# Patient Record
Sex: Female | Born: 1975 | Race: Black or African American | Hispanic: No | Marital: Single | Smoking: Current every day smoker
Health system: Southern US, Community
[De-identification: ages and names within clinical notes are randomized; demographics above are authoritative.]

## PROBLEM LIST (undated history)

## (undated) DIAGNOSIS — D649 Anemia, unspecified: Secondary | ICD-10-CM

## (undated) DIAGNOSIS — Z720 Tobacco use: Secondary | ICD-10-CM

## (undated) DIAGNOSIS — R87610 Atypical squamous cells of undetermined significance on cytologic smear of cervix (ASC-US): Secondary | ICD-10-CM

## (undated) DIAGNOSIS — E0590 Thyrotoxicosis, unspecified without thyrotoxic crisis or storm: Secondary | ICD-10-CM

## (undated) DIAGNOSIS — D259 Leiomyoma of uterus, unspecified: Secondary | ICD-10-CM

## (undated) DIAGNOSIS — I10 Essential (primary) hypertension: Secondary | ICD-10-CM

## (undated) DIAGNOSIS — N938 Other specified abnormal uterine and vaginal bleeding: Secondary | ICD-10-CM

## (undated) DIAGNOSIS — A528 Late syphilis, latent: Secondary | ICD-10-CM

## (undated) DIAGNOSIS — R87613 High grade squamous intraepithelial lesion on cytologic smear of cervix (HGSIL): Secondary | ICD-10-CM

## (undated) DIAGNOSIS — F419 Anxiety disorder, unspecified: Secondary | ICD-10-CM

## (undated) DIAGNOSIS — A5901 Trichomonal vulvovaginitis: Secondary | ICD-10-CM

## (undated) HISTORY — DX: Anxiety disorder, unspecified: F41.9

## (undated) HISTORY — DX: Trichomonal vulvovaginitis: A59.01

## (undated) HISTORY — DX: High grade squamous intraepithelial lesion on cytologic smear of cervix (HGSIL): R87.613

## (undated) HISTORY — DX: Thyrotoxicosis, unspecified without thyrotoxic crisis or storm: E05.90

## (undated) HISTORY — DX: Atypical squamous cells of undetermined significance on cytologic smear of cervix (ASC-US): R87.610

---

## 2004-09-28 ENCOUNTER — Emergency Department: Payer: Self-pay | Admitting: Emergency Medicine

## 2006-01-10 ENCOUNTER — Emergency Department: Payer: Self-pay | Admitting: Internal Medicine

## 2006-09-05 ENCOUNTER — Emergency Department: Payer: Self-pay | Admitting: Emergency Medicine

## 2006-09-07 ENCOUNTER — Emergency Department: Payer: Self-pay | Admitting: Emergency Medicine

## 2007-02-02 ENCOUNTER — Ambulatory Visit: Payer: Self-pay

## 2007-02-03 ENCOUNTER — Inpatient Hospital Stay: Payer: Self-pay

## 2010-08-03 ENCOUNTER — Ambulatory Visit: Payer: Self-pay

## 2010-08-09 ENCOUNTER — Ambulatory Visit: Payer: Self-pay

## 2010-08-13 LAB — PATHOLOGY REPORT

## 2016-02-27 ENCOUNTER — Emergency Department
Admission: EM | Admit: 2016-02-27 | Discharge: 2016-02-27 | Disposition: A | Discharge status: Home / Self Care | Payer: Self-pay | Attending: Emergency Medicine | Admitting: Emergency Medicine

## 2016-02-27 ENCOUNTER — Emergency Department: Payer: Self-pay

## 2016-02-27 ENCOUNTER — Encounter: Payer: Self-pay | Admitting: Emergency Medicine

## 2016-02-27 DIAGNOSIS — J111 Influenza due to unidentified influenza virus with other respiratory manifestations: Secondary | ICD-10-CM | POA: Insufficient documentation

## 2016-02-27 DIAGNOSIS — J40 Bronchitis, not specified as acute or chronic: Secondary | ICD-10-CM | POA: Insufficient documentation

## 2016-02-27 DIAGNOSIS — F17210 Nicotine dependence, cigarettes, uncomplicated: Secondary | ICD-10-CM | POA: Insufficient documentation

## 2016-02-27 MED ORDER — FLUTICASONE PROPIONATE 50 MCG/ACT NA SUSP: 2.0000 | Freq: Every day | NASAL | 0 refills | Status: DC

## 2016-02-27 MED ORDER — AZITHROMYCIN 250 MG PO TABS: ORAL_TABLET | ORAL | 0 refills | Status: DC

## 2016-02-27 MED ORDER — PROMETHAZINE-DM 6.25-15 MG/5ML PO SYRP: 5.0000 mL | ORAL_SOLUTION | Freq: Four times a day (QID) | ORAL | 0 refills | Status: DC | PRN

## 2016-02-27 MED ORDER — OSELTAMIVIR PHOSPHATE 75 MG PO CAPS: 75.0000 mg | ORAL_CAPSULE | Freq: Two times a day (BID) | ORAL | 0 refills | Status: DC

## 2016-02-27 NOTE — ED Triage Notes (Signed)
States she developed congestion cough about 2 weeks ago  Then sx's eased off   But returned 2 days ago

## 2016-02-27 NOTE — ED Provider Notes (Signed)
[facility] Emergency Department Provider Note  ____________________________________________  Time seen: Approximately 10:23 AM  I have reviewed the triage vital signs and the nursing notes.   HISTORY  Chief Complaint Cough and Generalized Body Aches    HPI Natalie Gregory is a 41 y.o. female , NAD, presents to the emergency department with 2 day history of either, chills, body aches, cough and chest congestion. Patient states she was ill with similar symptoms approximately 2 weeks ago. Symptoms resolved but over the last 2 days she states the symptoms reappeared suddenly and were worse. Has taken over-the-counter medications with little relief of her symptoms. Has been exposed to a family member who is had flu over the last week. Denies any chest pain, shortness breath, wheezing, abdominal pain, nausea, vomiting or diarrhea. No changes in urinary habits. Does note generalized body aches with the worst being at her back. States the body aches were so bad yesterday but she could not get out of bed. Denies injury or trauma.   History reviewed. No pertinent past medical history.  There are no active problems to display for this patient.   History reviewed. No pertinent surgical history.  Prior to Admission medications   Medication Sig Start Date End Date Taking? Authorizing Provider  azithromycin (ZITHROMAX Z-PAK) 250 MG tablet Take 2 tablets (500 mg) on  Day 1,  followed by 1 tablet (250 mg) once daily on Days 2 through 5. 02/27/16   Daysi Boggan L Boston Cookson, PA-C  fluticasone (FLONASE) 50 MCG/ACT nasal spray Place 2 sprays into both nostrils daily. 02/27/16   Dayja Loveridge L Khush Pasion, PA-C  oseltamivir (TAMIFLU) 75 MG capsule Take 1 capsule (75 mg total) by mouth 2 (two) times daily. 02/27/16   Aizen Duval L Zlata Alcaide, PA-C  promethazine-dextromethorphan (PROMETHAZINE-DM) 6.25-15 MG/5ML syrup Take 5 mLs by mouth 4 (four) times daily as needed for cough. 02/27/16   Dean Wonder L Simone Tuckey, PA-C     Allergies Patient has no known allergies.  No family history on file.  Social History Social History  Substance Use Topics  . Smoking status: Current Every Day Smoker  . Smokeless tobacco: Never Used  . Alcohol use No     Review of Systems  Constitutional: Positive fever, chills. No fatigue or decreased appetite. Eyes: No visual changes. No discharge, redness, swelling ENT: Positive nasal congestion, runny nose, sinus pressure, sore throat. No ear pain. Cardiovascular: No chest pain. Respiratory: As of cough, chest congestion. No shortness of breath. No wheezing.  Gastrointestinal: No abdominal pain.  No nausea, vomiting.  No diarrhea.  No constipation. Genitourinary: Negative for dysuria. No hematuria. No urinary hesitancy, urgency or increased frequency. Musculoskeletal: Positive for back pain and general myalgias. No neck pain. Skin: Negative for rash. Neurological: Positive for sinus headaches, but no focal weakness or numbness. 10-point ROS otherwise negative.  ____________________________________________   PHYSICAL EXAM:  VITAL SIGNS: ED Triage Vitals [02/27/16 1022]  Enc Vitals Group     BP      Pulse      Resp      Temp      Temp src      SpO2      Weight 155 lb (70.3 kg)     Height 5\' 4"  (1.626 m)     Head Circumference      Peak Flow      Pain Score 7     Pain Loc      Pain Edu?      Excl. in [place]?  Constitutional: Alert and oriented. Well appearing and in no acute distress. Eyes: Conjunctivae are normal Without icterus, injection or discharge. Head: Atraumatic. ENT:      Ears: TMs visualized bilaterally with trace serous effusion but no bulging, perforation or erythema.      Nose: Congestion with moderate clear rhinorrhea. Bilateral turbinates are injected.      Mouth/Throat: Mucous membranes are moist. Pharynx without erythema, swelling, exudate. Uvula is midline. Airway is patent. Clear postnasal drip. Neck: Supple with full range of  motion. Hematological/Lymphatic/Immunilogical: No cervical lymphadenopathy. Cardiovascular: Normal rate, regular rhythm. Normal S1 and S2.  Good peripheral circulation. Respiratory: Normal respiratory effort without tachypnea or retractions. Lungs CTAB with breath sounds noted in all lung fields. No wheeze, rhonchi, rales. Neurologic:  Normal speech and language. Normal gait and posture. No gross focal neurologic deficits are appreciated.  Skin:  Skin is warm, dry and intact. No rash noted. Psychiatric: Mood and affect are normal. Speech and behavior are normal. Patient exhibits appropriate insight and judgement.   ____________________________________________   LABS  None ____________________________________________  EKG  None ____________________________________________  RADIOLOGY I, [place], personally viewed and evaluated these images (plain radiographs) as part of my medical decision making, as well as reviewing the written report by the radiologist.  Dg Chest 2 View  Result Date: 02/27/2016 CLINICAL DATA:  Cough, congestion for 2 weeks, body aches EXAM: CHEST  2 VIEW COMPARISON:  None. FINDINGS: No active infiltrate or effusion is seen. Mediastinal and hilar contours are unremarkable. The heart is within normal limits in size. No bony abnormality is seen. IMPRESSION: No active cardiopulmonary disease. Electronically Signed   By: Ivar Drape M.D.   On: 02/27/2016 11:13    ____________________________________________    PROCEDURES  Procedure(s) performed: None   Procedures   Medications - No data to display   ____________________________________________   INITIAL IMPRESSION / ASSESSMENT AND PLAN / ED COURSE  Pertinent labs & imaging results that were available during my care of the patient were reviewed by me and considered in my medical decision making (see chart for details).  Clinical Course     Patient's diagnosis is consistent with Influenza and  bronchitis. Patient will be discharged home with prescriptions for azithromycin, Flonase, Tamiflu and promethazine DM to take as directed. Patient may take over-the-counter Tylenol or ibuprofen as see for aches or pains. Patient is to follow up with [facility] or [facility] if symptoms persist past this treatment course. Patient is given ED precautions to return to the ED for any worsening or new symptoms.    ____________________________________________  FINAL CLINICAL IMPRESSION(S) / ED DIAGNOSES  Final diagnoses:  Influenza  Bronchitis      NEW MEDICATIONS STARTED DURING THIS VISIT:  Discharge Medication List as of 02/27/2016 11:23 AM    START taking these medications   Details  azithromycin (ZITHROMAX Z-PAK) 250 MG tablet Take 2 tablets (500 mg) on  Day 1,  followed by 1 tablet (250 mg) once daily on Days 2 through 5., Print    fluticasone (FLONASE) 50 MCG/ACT nasal spray Place 2 sprays into both nostrils daily., Starting Tue 02/27/2016, Print    oseltamivir (TAMIFLU) 75 MG capsule Take 1 capsule (75 mg total) by mouth 2 (two) times daily., Starting Tue 02/27/2016, Print    promethazine-dextromethorphan (PROMETHAZINE-DM) 6.25-15 MG/5ML syrup Take 5 mLs by mouth 4 (four) times daily as needed for cough., Starting Tue 02/27/2016, Print  Braxton Feathers, PA-C 02/27/16 [place] Quigley, MD 02/27/16 [phone]

## 2017-04-19 ENCOUNTER — Emergency Department: Payer: BLUE CROSS/BLUE SHIELD

## 2017-04-19 ENCOUNTER — Emergency Department
Admission: EM | Admit: 2017-04-19 | Discharge: 2017-04-19 | Disposition: A | Discharge status: Home / Self Care | Payer: BLUE CROSS/BLUE SHIELD | Attending: Emergency Medicine | Admitting: Emergency Medicine

## 2017-04-19 ENCOUNTER — Other Ambulatory Visit: Payer: Self-pay

## 2017-04-19 ENCOUNTER — Encounter: Payer: Self-pay | Admitting: Emergency Medicine

## 2017-04-19 DIAGNOSIS — F17210 Nicotine dependence, cigarettes, uncomplicated: Secondary | ICD-10-CM | POA: Diagnosis not present

## 2017-04-19 DIAGNOSIS — Z79899 Other long term (current) drug therapy: Secondary | ICD-10-CM | POA: Insufficient documentation

## 2017-04-19 DIAGNOSIS — J189 Pneumonia, unspecified organism: Secondary | ICD-10-CM

## 2017-04-19 DIAGNOSIS — J069 Acute upper respiratory infection, unspecified: Secondary | ICD-10-CM | POA: Diagnosis not present

## 2017-04-19 DIAGNOSIS — R079 Chest pain, unspecified: Secondary | ICD-10-CM

## 2017-04-19 LAB — URINE DRUG SCREEN, QUALITATIVE (ARMC ONLY)
Amphetamines, Ur Screen: NOT DETECTED
Barbiturates, Ur Screen: NOT DETECTED
Benzodiazepine, Ur Scrn: NOT DETECTED
Cannabinoid 50 Ng, Ur ~~LOC~~: NOT DETECTED
Cocaine Metabolite,Ur ~~LOC~~: NOT DETECTED
MDMA (Ecstasy)Ur Screen: NOT DETECTED
Methadone Scn, Ur: NOT DETECTED
Opiate, Ur Screen: NOT DETECTED
Phencyclidine (PCP) Ur S: NOT DETECTED
Tricyclic, Ur Screen: POSITIVE — AB

## 2017-04-19 LAB — BASIC METABOLIC PANEL
Anion gap: 12 (ref 5–15)
BUN: 14 mg/dL (ref 6–20)
CO2: 24 mmol/L (ref 22–32)
Calcium: 9.4 mg/dL (ref 8.9–10.3)
Chloride: 100 mmol/L — ABNORMAL LOW (ref 101–111)
Creatinine, Ser: 0.57 mg/dL (ref 0.44–1.00)
GFR calc Af Amer: >60 mL/min (ref >60)
GFR calc non Af Amer: >60 mL/min (ref >60)
Glucose, Bld: 110 mg/dL — ABNORMAL HIGH (ref 65–99)
Potassium: 2.7 mmol/L — CL (ref 3.5–5.1)
Sodium: 136 mmol/L (ref 135–145)

## 2017-04-19 LAB — URINALYSIS, COMPLETE (UACMP) WITH MICROSCOPIC
Bacteria, UA: NONE SEEN
Bilirubin Urine: NEGATIVE
Glucose, UA: NEGATIVE mg/dL
Ketones, ur: 5 mg/dL — AB
Leukocytes, UA: NEGATIVE
Nitrite: NEGATIVE
Protein, ur: NEGATIVE mg/dL
Specific Gravity, Urine: 1.021 (ref 1.005–1.030)
pH: 5 (ref 5.0–8.0)

## 2017-04-19 LAB — CBC
HCT: 33.8 % — ABNORMAL LOW (ref 35.0–47.0)
Hemoglobin: 10.6 g/dL — ABNORMAL LOW (ref 12.0–16.0)
MCH: 23.2 pg — ABNORMAL LOW (ref 26.0–34.0)
MCHC: 31.2 g/dL — ABNORMAL LOW (ref 32.0–36.0)
MCV: 74.4 fL — ABNORMAL LOW (ref 80.0–100.0)
Platelets: 446 10*3/uL — ABNORMAL HIGH (ref 150–440)
RBC: 4.55 MIL/uL (ref 3.80–5.20)
RDW: 19 % — ABNORMAL HIGH (ref 11.5–14.5)
WBC: 16.9 10*3/uL — ABNORMAL HIGH (ref 3.6–11.0)

## 2017-04-19 LAB — TROPONIN I
Troponin I: <0.03 ng/mL (ref <0.03)
Troponin I: <0.03 ng/mL (ref <0.03)

## 2017-04-19 LAB — POCT PREGNANCY, URINE: Preg Test, Ur: NEGATIVE

## 2017-04-19 LAB — ETHANOL: Alcohol, Ethyl (B): 150 mg/dL — ABNORMAL HIGH (ref <10)

## 2017-04-19 LAB — LACTIC ACID, PLASMA
Lactic Acid, Venous: 2.2 mmol/L (ref 0.5–1.9)
Lactic Acid, Venous: 1.9 mmol/L (ref 0.5–1.9)

## 2017-04-19 MED ORDER — SODIUM CHLORIDE 0.9 % IV SOLN
1.0000 g | INTRAVENOUS | Status: DC
  Administered 2017-04-19: 1 g via INTRAVENOUS
  Filled 2017-04-19: qty 10

## 2017-04-19 MED ORDER — POTASSIUM CHLORIDE CRYS ER 20 MEQ PO TBCR
40.0000 meq | EXTENDED_RELEASE_TABLET | Freq: Once | ORAL | Status: AC
Start: 2017-04-19 — End: 2017-04-19
  Administered 2017-04-19: 40 meq via ORAL
  Filled 2017-04-19: qty 2

## 2017-04-19 MED ORDER — SODIUM CHLORIDE 0.9 % IV BOLUS (SEPSIS)
1000.0000 mL | Freq: Once | INTRAVENOUS | Status: AC
  Administered 2017-04-19: 1000 mL via INTRAVENOUS

## 2017-04-19 MED ORDER — AMOXICILLIN 500 MG PO CAPS: 500.0000 mg | ORAL_CAPSULE | Freq: Three times a day (TID) | ORAL | 0 refills | Status: DC

## 2017-04-19 MED ORDER — AZITHROMYCIN 250 MG PO TABS: 250.0000 mg | ORAL_TABLET | Freq: Every day | ORAL | 0 refills | Status: DC

## 2017-04-19 MED ORDER — IOPAMIDOL (ISOVUE-370) INJECTION 76%
75.0000 mL | Freq: Once | INTRAVENOUS | Status: AC | PRN
  Administered 2017-04-19: 75 mL via INTRAVENOUS

## 2017-04-19 MED ORDER — AZITHROMYCIN 500 MG PO TABS
500.0000 mg | ORAL_TABLET | Freq: Once | ORAL | Status: AC
  Administered 2017-04-19: 500 mg via ORAL
  Filled 2017-04-19: qty 1

## 2017-04-19 MED ORDER — ASPIRIN 81 MG PO CHEW
324.0000 mg | CHEWABLE_TABLET | Freq: Once | ORAL | Status: AC
  Administered 2017-04-19: 324 mg via ORAL
  Filled 2017-04-19: qty 4

## 2017-04-19 NOTE — ED Triage Notes (Signed)
Pt reports tightness to the center of her chest for about 45 minutes; also having right shoulder pain for 3-4 weeks; pt says tonight she had laid down to sleep when tightness started; reports short of breath when pain started but not presently; denies diaphoresis; denies nausea; chest pain is non-radiating

## 2017-04-19 NOTE — ED Provider Notes (Signed)
Repeat lactic acid improved.  Normal hemodynamics.  Vitals:   04/19/17 0600 04/19/17 0630  BP: 130/83 129/86  Pulse: 99 97  Resp: 20 18  Temp:    SpO2: 100% 99%    Patient is resting comfortably in no distress.  Reports she feels well.  She is alert, fully oriented.  Discussed treatment for pneumonia and careful return precautions with the patient is comfortable with plan for discharge.  Return precautions and treatment recommendations and follow-up discussed with the patient who is agreeable with the plan.    Delman Kitten, MD 04/19/17 [phone]

## 2017-04-19 NOTE — ED Notes (Signed)
Pt awakened from sleep to give discharge instructions, she denies pain or discomfort, reports feeling very sleepy.

## 2017-04-19 NOTE — ED Provider Notes (Signed)
[facility] Emergency Department Provider Note   ____________________________________________   First MD Initiated Contact with Patient 04/19/17 0310     (approximate)  I have reviewed the triage vital signs and the nursing notes.   HISTORY  Chief Complaint Chest Pain    HPI Natalie Gregory is a 42 y.o. female who presents to the ED from home with a chief complaint of chest tightness.  Patient was in her usual state of health and getting ready for bed when she experienced sudden onset of chest tightness approximately 45 minutes prior to arrival.  Initially she felt short of breath and panicky but that has resolved.  Denies similar symptoms previously.  Did have several drinks earlier in the evening but denies illicit drug use.  Denies recent fever, chills, cough, abdominal pain, nausea, vomiting, diarrhea.  Has been treated for nontraumatic right shoulder pain but otherwise does not take prescription medications.  Denies recent travel, trauma or hormone use.   Past medical history None  There are no active problems to display for this patient.   Past Surgical History:  Procedure Laterality Date  . CESAREAN SECTION      Prior to Admission medications   Medication Sig Start Date End Date Taking? Authorizing Provider  metaxalone (SKELAXIN) 800 MG tablet Take 800 mg by mouth 3 (three) times daily as needed for muscle spasms.   Yes [provider]  predniSONE (DELTASONE) 20 MG tablet Take 20 mg by mouth 2 (two) times daily with a meal.   Yes [provider]  amoxicillin (AMOXIL) 500 MG capsule Take 1 capsule (500 mg total) by mouth 3 (three) times daily. 04/19/17   Paulette Blanch, MD  azithromycin (ZITHROMAX) 250 MG tablet Take 1 tablet (250 mg total) by mouth daily. 04/19/17   Paulette Blanch, MD    Allergies Patient has no known allergies.  Family history None  Social History Social History   Tobacco Use  . Smoking status: Current Every  Day Smoker    Packs/day: 0.50    Types: Cigarettes  . Smokeless tobacco: Never Used  Substance Use Topics  . Alcohol use: No  . Drug use: No    Review of Systems  Constitutional: No fever/chills. Eyes: No visual changes. ENT: No sore throat. Cardiovascular: Positive for chest pain. Respiratory: Denies shortness of breath. Gastrointestinal: No abdominal pain.  No nausea, no vomiting.  No diarrhea.  No constipation. Genitourinary: Negative for dysuria. Musculoskeletal: Negative for back pain. Skin: Negative for rash. Neurological: Negative for headaches, focal weakness or numbness.   ____________________________________________   PHYSICAL EXAM:  VITAL SIGNS: ED Triage Vitals  Enc Vitals Group     BP 04/19/17 0218 139/72     Pulse Rate 04/19/17 0218 (!) 118     Resp 04/19/17 0218 18     Temp 04/19/17 0218 97.9 F (36.6 C)     Temp Source 04/19/17 0218 Oral     SpO2 04/19/17 0218 100 %     Weight 04/19/17 0219 154 lb (69.9 kg)     Height 04/19/17 0219 5\' 4"  (1.626 m)     Head Circumference --      Peak Flow --      Pain Score 04/19/17 0219 7     Pain Loc --      Pain Edu? --      Excl. in [place]? --     Constitutional: Alert and oriented.  Uncomfortable appearing and in no acute distress. Eyes:  Conjunctivae are normal. PERRL. EOMI. Head: Atraumatic. Nose: No congestion/rhinnorhea. Mouth/Throat: Mucous membranes are moist.  Oropharynx non-erythematous. Neck: No stridor.   Cardiovascular: Tachycardic rate, regular rhythm. Grossly normal heart sounds.  Good peripheral circulation. Respiratory: Normal respiratory effort.  No retractions. Lungs CTAB. Gastrointestinal: Soft and nontender. No distention. No abdominal bruits. No CVA tenderness. Musculoskeletal: No lower extremity tenderness nor edema.  No joint effusions. Neurologic:  Normal speech and language. No gross focal neurologic deficits are appreciated. No gait instability. Skin:  Skin is warm, dry and intact. No  rash noted. Psychiatric: Mood and affect are normal. Speech and behavior are normal.  ____________________________________________   LABS (all labs ordered are listed, but only abnormal results are displayed)  Labs Reviewed  BASIC METABOLIC PANEL - Abnormal; Notable for the following components:      Result Value   Potassium 2.7 (*)    Chloride 100 (*)    Glucose, Bld 110 (*)    All other components within normal limits  CBC - Abnormal; Notable for the following components:   WBC 16.9 (*)    Hemoglobin 10.6 (*)    HCT 33.8 (*)    MCV 74.4 (*)    MCH 23.2 (*)    MCHC 31.2 (*)    RDW 19.0 (*)    Platelets 446 (*)    All other components within normal limits  URINALYSIS, COMPLETE (UACMP) WITH MICROSCOPIC - Abnormal; Notable for the following components:   Color, Urine YELLOW (*)    APPearance HAZY (*)    Hgb urine dipstick SMALL (*)    Ketones, ur 5 (*)    Squamous Epithelial / LPF 6-30 (*)    All other components within normal limits  URINE DRUG SCREEN, QUALITATIVE ([facility] ONLY) - Abnormal; Notable for the following components:   Tricyclic, Ur Screen POSITIVE (*)    All other components within normal limits  ETHANOL - Abnormal; Notable for the following components:   Alcohol, Ethyl (B) 150 (*)    All other components within normal limits  LACTIC ACID, PLASMA - Abnormal; Notable for the following components:   Lactic Acid, Venous 2.2 (*)    All other components within normal limits  CULTURE, BLOOD (ROUTINE X 2)  CULTURE, BLOOD (ROUTINE X 2)  TROPONIN I  TROPONIN I  LACTIC ACID, PLASMA  POC URINE PREG, ED  POC URINE PREG, ED  POCT PREGNANCY, URINE   ____________________________________________  EKG  ED ECG REPORT I, SUNG,JADE J, the attending physician, personally viewed and interpreted this ECG.   Date: 04/19/2017  EKG Time: 0215  Rate: 116  Rhythm: sinus tachycardia  Axis: Normal  Intervals:none  ST&T Change:  Nonspecific  ____________________________________________  RADIOLOGY  ED MD interpretation: Question small opacity which may reflect edema  Official radiology report(s): Dg Chest 2 View  Result Date: 04/19/2017 CLINICAL DATA:  Acute onset of central chest tightness and right shoulder pain. EXAM: CHEST - 2 VIEW COMPARISON:  Chest radiograph performed 02/27/2016 FINDINGS: The lungs are well-aerated. Minimal right basilar opacity may reflect mild interstitial edema. There is no evidence of pleural effusion or pneumothorax. The heart is normal in size; the mediastinal contour is within normal limits. No acute osseous abnormalities are seen. IMPRESSION: Minimal right basilar opacity may reflect mild interstitial edema. Lungs otherwise clear. Electronically Signed   By: Garald Balding M.D.   On: 04/19/2017 02:53   Ct Angio Chest Pe W/cm &/or Wo Cm  Result Date: 04/19/2017 CLINICAL DATA:  Acute onset of central chest  tightness. Subacute onset of right shoulder pain. Shortness of breath. EXAM: CT ANGIOGRAPHY CHEST WITH CONTRAST TECHNIQUE: Multidetector CT imaging of the chest was performed using the standard protocol during bolus administration of intravenous contrast. Multiplanar CT image reconstructions and MIPs were obtained to evaluate the vascular anatomy. CONTRAST:  55mL ISOVUE-370 IOPAMIDOL (ISOVUE-370) INJECTION 76% COMPARISON:  Chest radiograph performed earlier today at 2:46 a.m. FINDINGS: Cardiovascular:  There is no evidence of pulmonary embolus. The heart is normal in size. The thoracic aorta is unremarkable. The great vessels are within normal limits. Mediastinum/Nodes: The mediastinum is unremarkable in appearance. No mediastinal lymphadenopathy is seen. No pericardial effusion is identified. Residual contrast is seen within the esophagus. The thyroid gland is unremarkable in appearance. No axillary lymphadenopathy is seen. Lungs/Pleura: A few small ring-shaped airspace opacities are noted at the  lung apices bilaterally, raising question for underlying atypical infection or inflammatory condition. No pleural effusion or pneumothorax is seen. No masses are identified. Upper Abdomen: The visualized portions of the liver and spleen are unremarkable. The visualized portions of the pancreas, adrenal glands and kidneys are within normal limits. Musculoskeletal: No acute osseous abnormalities are identified. The visualized musculature is unremarkable in appearance. Review of the MIP images confirms the above findings. IMPRESSION: 1. No evidence of pulmonary embolus. 2. Few small ring-shaped airspace opacities at the lung apices bilaterally. This is an unusual appearance, and raises question for an underlying atypical infection or inflammatory condition. Would correlate with the patient's symptoms and lab findings. Electronically Signed   By: Garald Balding M.D.   On: 04/19/2017 05:18    ____________________________________________   PROCEDURES  Procedure(s) performed: None  Procedures  Critical Care performed: No  ____________________________________________   INITIAL IMPRESSION / ASSESSMENT AND PLAN / ED COURSE  As part of my medical decision making, I reviewed the following data within the [place] notes reviewed and incorporated, Labs reviewed, EKG interpreted, Radiograph reviewed  and Notes from prior ED visits   42 year old healthy female who presents with sudden onset central chest tightness and tachycardia. Differential diagnosis includes, but is not limited to, ACS, aortic dissection, pulmonary embolism, cardiac tamponade, pneumothorax, pneumonia, pericarditis, myocarditis, GI-related causes including esophagitis/gastritis, and musculoskeletal chest wall pain.    Laboratory results remarkable for leukocytosis, hypokalemia, elevated EtOH, normal troponin.  Question opacity versus edema on chest x-ray.  Given patient's symptoms, will obtain CT chest to  evaluate for pulmonary embolus.  Administer IV fluid resuscitation and oral potassium.     ____________________________________________   FINAL CLINICAL IMPRESSION(S) / ED DIAGNOSES  Final diagnoses:  Nonspecific chest pain  Lung infection     ED Discharge Orders        Ordered    amoxicillin (AMOXIL) 500 MG capsule  3 times daily     04/19/17 0628    azithromycin (ZITHROMAX) 250 MG tablet  Daily     04/19/17 3536       Note:  This document was prepared using Dragon voice recognition software and may include unintentional dictation errors.    Paulette Blanch, MD 04/19/17 [phone]

## 2017-04-19 NOTE — ED Notes (Signed)
Pt notified urine sample is needed, pt attempted to use BR however is not able to give a sample at this time.

## 2017-04-19 NOTE — ED Notes (Signed)
Patient transported to CT 

## 2017-04-19 NOTE — Discharge Instructions (Addendum)
1.  Take antibiotics as prescribed: Amoxicillin 500 mg 3 times daily for 7 days Azithromycin 250 mg daily for 4 days 2.  Return to the ER for worsening symptoms, persistent vomiting, difficulty breathing or other concerns.

## 2017-04-20 ENCOUNTER — Other Ambulatory Visit: Payer: Self-pay

## 2017-04-20 ENCOUNTER — Emergency Department: Payer: BLUE CROSS/BLUE SHIELD

## 2017-04-20 ENCOUNTER — Emergency Department
Admission: EM | Admit: 2017-04-20 | Discharge: 2017-04-20 | Disposition: A | Discharge status: Home / Self Care | Payer: BLUE CROSS/BLUE SHIELD | Attending: Emergency Medicine | Admitting: Emergency Medicine

## 2017-04-20 ENCOUNTER — Encounter: Payer: Self-pay | Admitting: Emergency Medicine

## 2017-04-20 DIAGNOSIS — M25511 Pain in right shoulder: Secondary | ICD-10-CM | POA: Diagnosis not present

## 2017-04-20 DIAGNOSIS — F17210 Nicotine dependence, cigarettes, uncomplicated: Secondary | ICD-10-CM | POA: Diagnosis not present

## 2017-04-20 DIAGNOSIS — R1011 Right upper quadrant pain: Secondary | ICD-10-CM | POA: Diagnosis not present

## 2017-04-20 MED ORDER — PREDNISONE 10 MG (21) PO TBPK: ORAL_TABLET | ORAL | 0 refills | Status: DC

## 2017-04-20 MED ORDER — CYCLOBENZAPRINE HCL 10 MG PO TABS: 10.0000 mg | ORAL_TABLET | Freq: Three times a day (TID) | ORAL | 0 refills | Status: DC | PRN

## 2017-04-20 NOTE — ED Provider Notes (Signed)
[facility] Emergency Department Provider Note  ____________________________________________   First MD Initiated Contact with Patient 04/20/17 1150     (approximate)  I have reviewed the triage vital signs and the nursing notes.   HISTORY  Chief Complaint Back Pain and Shoulder Pain    HPI Natalie Gregory is a 42 y.o. female test to the emergency department complaining of continued right shoulder pain that became worse last night.  She was seen here yesterday in the emergency department and diagnosed with questionable pneumonia versus inflammatory problems the chest CT.  She states that she started the antibiotic and has been taking this prescribed but is not feeling any better.  She also states that she was seen 2 weeks ago [facility] for [facility] same problem.  2 days later she went to [facility] for the same problem.  So we are the fourth.  For the same pain.  She states that the [place] [facility] gave her muscle relaxers and anti-inflammatories which did not really help with the pain.  She denies any vomiting or burping or bloating.  She does still have her gallbladder.  She denies any fever or chills  History reviewed. No pertinent past medical history.  There are no active problems to display for this patient.   Past Surgical History:  Procedure Laterality Date  . CESAREAN SECTION      Prior to Admission medications   Medication Sig Start Date End Date Taking? Authorizing Provider  amoxicillin (AMOXIL) 500 MG capsule Take 1 capsule (500 mg total) by mouth 3 (three) times daily. 04/19/17   Paulette Blanch, MD  azithromycin (ZITHROMAX) 250 MG tablet Take 1 tablet (250 mg total) by mouth daily. 04/19/17   Paulette Blanch, MD  cyclobenzaprine (FLEXERIL) 10 MG tablet Take 1 tablet (10 mg total) by mouth 3 (three) times daily as needed for muscle spasms. 04/20/17   Dusten Ellinwood, Linden Dolin, PA-C  metaxalone (SKELAXIN) 800 MG tablet Take 800 mg by mouth 3 (three) times daily as  needed for muscle spasms.    [provider]  predniSONE (STERAPRED UNI-PAK 21 TAB) 10 MG (21) TBPK tablet Take 6 pills on day one then decrease by 1 pill each day 04/20/17   Versie Starks, PA-C    Allergies Patient has no known allergies.  History reviewed. No pertinent family history.  Social History Social History   Tobacco Use  . Smoking status: Current Every Day Smoker    Packs/day: 0.50    Types: Cigarettes  . Smokeless tobacco: Never Used  Substance Use Topics  . Alcohol use: No  . Drug use: No    Review of Systems  Constitutional: No fever/chills Eyes: No visual changes. ENT: No sore throat. Respiratory: Denies cough Cardiovascular: Denies chest pain Gastrointestinal: Denies vomiting or diarrhea Genitourinary: Negative for dysuria. Musculoskeletal: Positive for right upper back pain and shoulder pain  skin: Negative for rash.    ____________________________________________   PHYSICAL EXAM:  VITAL SIGNS: ED Triage Vitals  Enc Vitals Group     BP 04/20/17 1116 (!) 164/101     Pulse Rate 04/20/17 1116 82     Resp 04/20/17 1116 18     Temp 04/20/17 1116 98.1 F (36.7 C)     Temp Source 04/20/17 1116 Oral     SpO2 04/20/17 1116 99 %     Weight 04/20/17 1114 154 lb (69.9 kg)     Height 04/20/17 1114 5\' 4"  (1.626 m)  Head Circumference --      Peak Flow --      Pain Score 04/20/17 1114 8     Pain Loc --      Pain Edu? --      Excl. in [place]? --     Constitutional: Alert and oriented. Well appearing and in no acute distress. Eyes: Conjunctivae are normal.  Head: Atraumatic. Nose: No congestion/rhinnorhea. Mouth/Throat: Mucous membranes are moist.   Cardiovascular: Normal rate, regular rhythm.  Heart sounds are normal Respiratory: Normal respiratory effort.  No retractions, lungs clear to auscultation Abdomen: Tender in the right upper quadrant negative Murphy sign, negative McBurney's point tenderness GU: deferred Musculoskeletal: FROM  all extremities, warm and well perfused, right shoulder is tender posteriorly in the muscle Neurologic:  Normal speech and language.  Skin:  Skin is warm, dry and intact. No rash noted. Psychiatric: Mood and affect are normal. Speech and behavior are normal.  ____________________________________________   LABS (all labs ordered are listed, but only abnormal results are displayed)  Labs Reviewed - No data to display ____________________________________________   ____________________________________________  RADIOLOGY  Ultrasound of the abdomen limited to the right upper quadrant is negative for any gallstones  ____________________________________________   PROCEDURES  Procedure(s) performed: No  Procedures    ____________________________________________   INITIAL IMPRESSION / ASSESSMENT AND PLAN / ED COURSE  Pertinent labs & imaging results that were available during my care of the patient were reviewed by me and considered in my medical decision making (see chart for details).  Patient is a 42 year old female complaining of right upper shoulder and back pain.  She has been seen by 3 physicians prior to coming here today.  She was discharged yesterday morning from here in the emergency department.  Yesterday she was diagnosed with pneumonia.  CT of the chest was negative for PE but did show an inflammatory process.  Today she states the pain in her back is still there and is hurting worse.  She still has a gallbladder.  She has previously taken muscle relaxers without any relief of the same back pain.  On physical exam the right shoulder is tender along the muscles.  The right upper quadrant is tender to palpation but is a negative Murphy sign  Ultrasound of the right upper quadrant limited view for gallbladder is ordered    ----------------------------------------- 3:38 PM on 04/20/2017 -----------------------------------------  Ultrasound the right upper quadrant was  negative for any gallstones.  The patient was given the results.  Explained to her that she may have some musculoskeletal pain associated with the pneumonia.  She was given a prescription for muscle relaxer and steroid.  She is to use these medications as instructed.  She is to follow-up with regular doctor if not improving in 3 days.  Return to the emergency department if worsening.  She states she understands will comply with our instructions.  She was discharged in stable condition  As part of my medical decision making, I reviewed the following data within the [place] notes reviewed and incorporated, Labs reviewed , Old chart reviewed, Radiograph reviewed ultrasound of the right upper quadrant is negative, Notes from prior ED visits and [place] Controlled Substance Database  ____________________________________________   FINAL CLINICAL IMPRESSION(S) / ED DIAGNOSES  Final diagnoses:  RUQ pain  Acute pain of right shoulder      NEW MEDICATIONS STARTED DURING THIS VISIT:  Discharge Medication List as of 04/20/2017  1:44 PM    START taking  these medications   Details  cyclobenzaprine (FLEXERIL) 10 MG tablet Take 1 tablet (10 mg total) by mouth 3 (three) times daily as needed for muscle spasms., Starting Sun 04/20/2017, Print    predniSONE (STERAPRED UNI-PAK 21 TAB) 10 MG (21) TBPK tablet Take 6 pills on day one then decrease by 1 pill each day, Print         Note:  This document was prepared using Dragon voice recognition software and may include unintentional dictation errors.    Versie Starks, PA-C 04/20/17 1539    Lisa Roca, MD 04/23/17 [id number]

## 2017-04-20 NOTE — Discharge Instructions (Signed)
Follow-up with your regular doctor if not better in 3-5 days.  Use medication as prescribed.  Continue the medication that you are given last night.  You do not have any gallbladder disease at this time.  Your shoulder pain is mostly musculoskeletal but may be aggravated by the pneumonia.  Return to emergency department if you are worsening

## 2017-04-20 NOTE — ED Notes (Addendum)
See triage note  States she was seen yesterday and placed on antibiotics for pne  ..states having increased pain to right posterior shoulder  States this pain has been there but pain became worse last pm area is tender to touch to back  States she was seen for possible pulled muscle  But pain is not any better

## 2017-04-20 NOTE — ED Triage Notes (Addendum)
Pt arrived via POV with c/o worse right shoulder pain and back pain.  Pt states she was dx with PNA early Saturday morning. Pt states she is taking her medication, but states the pain in her shoulder and back are keeping her up at night. Pt states the pain in her right shoulder has been there for about 3-4 weeks.

## 2017-04-24 LAB — CULTURE, BLOOD (ROUTINE X 2)
Culture: NO GROWTH
Culture: NO GROWTH
Special Requests: ADEQUATE

## 2017-05-15 ENCOUNTER — Ambulatory Visit: Payer: Self-pay | Admitting: Nurse Practitioner

## 2017-06-02 ENCOUNTER — Ambulatory Visit: Payer: Self-pay | Admitting: Obstetrics and Gynecology

## 2017-06-06 ENCOUNTER — Encounter: Payer: Self-pay | Admitting: Maternal Newborn

## 2017-06-06 ENCOUNTER — Ambulatory Visit (INDEPENDENT_AMBULATORY_CARE_PROVIDER_SITE_OTHER): Payer: BLUE CROSS/BLUE SHIELD | Admitting: Maternal Newborn

## 2017-06-06 VITALS — BP 150/92 | HR 73 | Ht 64.0 in | Wt 156.0 lb

## 2017-06-06 DIAGNOSIS — Z124 Encounter for screening for malignant neoplasm of cervix: Secondary | ICD-10-CM

## 2017-06-06 DIAGNOSIS — F329 Major depressive disorder, single episode, unspecified: Secondary | ICD-10-CM

## 2017-06-06 DIAGNOSIS — F32A Depression, unspecified: Secondary | ICD-10-CM | POA: Insufficient documentation

## 2017-06-06 DIAGNOSIS — Z30013 Encounter for initial prescription of injectable contraceptive: Secondary | ICD-10-CM

## 2017-06-06 DIAGNOSIS — Z01419 Encounter for gynecological examination (general) (routine) without abnormal findings: Secondary | ICD-10-CM

## 2017-06-06 DIAGNOSIS — F419 Anxiety disorder, unspecified: Secondary | ICD-10-CM

## 2017-06-06 MED ORDER — SERTRALINE HCL 50 MG PO TABS: 50.0000 mg | ORAL_TABLET | Freq: Every day | ORAL | 2 refills | Status: DC

## 2017-06-06 MED ORDER — MEDROXYPROGESTERONE ACETATE 150 MG/ML IM SUSP: 150.0000 mg | INTRAMUSCULAR | 3 refills | Status: DC

## 2017-06-06 NOTE — Progress Notes (Signed)
Gynecology Annual Exam  PCP: Natalie Gregory, No Pcp Per  Chief Complaint:  Chief Complaint  Natalie Gregory presents with  . Gynecologic Exam    History of Present Illness: Natalie Gregory is a 42 y.o. G3P3003 presents for annual exam. The Natalie Gregory has symptoms of anxiety and depression.  LMP: Natalie Gregory's last menstrual period was 05/30/2017. Average Interval: regular, 28 days Duration of flow: 5 days Heavy Menses: yes Clots: no Intermenstrual Bleeding: no Postcoital Bleeding: no Dysmenorrhea: yes  The Natalie Gregory is sexually active. She currently uses none for contraception. She denies dyspareunia.  The Natalie Gregory does not perform self breast exams.  There is no notable family history of breast or ovarian cancer in her family.  The Natalie Gregory wears seatbelts: yes.   The Natalie Gregory has regular exercise: no.    The Natalie Gregory reports current symptoms of depression and anxiety. She relates several situational and family stressors. She has difficulty sleeping, racing thoughts, loss of appetite, weight loss, and a lack of desire to leave her house at times. She does not have thoughts of self harm.  Review of Systems  Constitutional: Positive for malaise/fatigue and weight loss.  HENT: Negative.   Eyes: Negative.        Will see eye doctor as she has age-related visual changes (presbyopia)  Respiratory: Negative for cough, shortness of breath and wheezing.   Cardiovascular: Negative for chest pain and palpitations.  Gastrointestinal: Negative for abdominal pain, constipation, diarrhea and heartburn.  Genitourinary: Negative.   Musculoskeletal: Negative.   Skin: Negative.   Neurological: Negative.   Endo/Heme/Allergies: Negative.   Psychiatric/Behavioral: Positive for depression. The Natalie Gregory is nervous/anxious.   All other systems reviewed and are negative.   Past Medical History:  Past Medical History:  Diagnosis Date  . Atypical squamous cell changes of undetermined significance (ASCUS) on cervical cytology  with negative high risk human papilloma virus (HPV) test result   . 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 Trichomonal vulvovaginitis     Past Surgical History:  Past Surgical History:  Procedure Laterality Date  . CESAREAN SECTION      Gynecologic History:  Natalie Gregory's last menstrual period was 05/30/2017. Contraception: none Last Pap: 2015 Results were: no abnormalities   Obstetric History: [id number]  Family History:  Family History  Problem Relation Age of Onset  . Hypertension Mother   . Diabetes Brother   . Diabetes Maternal Aunt   . Hypertension Maternal Aunt   . Diabetes Maternal Uncle   . Hypertension Maternal Uncle     Social History:  Social History   Socioeconomic History  . Marital status: Single    Spouse name: Not on file  . Number of children: Not on file  . Years of education: Not on file  . Highest education level: Not on file  Occupational History  . Not on file  Social Needs  . Financial resource strain: Not on file  . Food insecurity:    Worry: Not on file    Inability: Not on file  . Transportation needs:    Medical: Not on file    Non-medical: Not on file  Tobacco Use  . Smoking status: Current Every Day Smoker    Packs/day: 0.50    Types: Cigarettes  . Smokeless tobacco: Never Used  Substance and Sexual Activity  . Alcohol use: No  . Drug use: No  . Sexual activity: Yes    Birth control/protection: None  Lifestyle  . Physical activity:  Days per week: Not on file    Minutes per session: Not on file  . Stress: Not on file  Relationships  . Social connections:    Talks on phone: Not on file    Gets together: Not on file    Attends religious service: Not on file    Active member of club or organization: Not on file    Attends meetings of clubs or organizations: Not on file    Relationship status: Not on file  . Intimate partner violence:    Fear of current or ex partner: Not on file      Emotionally abused: Not on file    Physically abused: Not on file    Forced sexual activity: Not on file  Other Topics Concern  . Not on file  Social History Narrative  . Not on file    Allergies:  No Known Allergies  Medications: Prior to Admission medications   Medication Sig Start Date End Date Taking? Authorizing Provider  amoxicillin (AMOXIL) 500 MG capsule Take 1 capsule (500 mg total) by mouth 3 (three) times daily. Natalie Gregory not taking: Reported on 06/06/2017 04/19/17   Paulette Blanch, MD  azithromycin (ZITHROMAX) 250 MG tablet Take 1 tablet (250 mg total) by mouth daily. Natalie Gregory not taking: Reported on 06/06/2017 04/19/17   Paulette Blanch, MD  cyclobenzaprine (FLEXERIL) 10 MG tablet Take 1 tablet (10 mg total) by mouth 3 (three) times daily as needed for muscle spasms. Natalie Gregory not taking: Reported on 06/06/2017 04/20/17   Versie Starks, PA-C  metaxalone (SKELAXIN) 800 MG tablet Take 800 mg by mouth 3 (three) times daily as needed for muscle spasms.    [provider]  predniSONE (STERAPRED UNI-PAK 21 TAB) 10 MG (21) TBPK tablet Take 6 pills on day one then decrease by 1 pill each day Natalie Gregory not taking: Reported on 06/06/2017 04/20/17   Versie Starks, PA-C    Physical Exam Vitals: Blood pressure (!) 150/92, pulse 73, height 5\' 4"  (1.626 m), weight 156 lb (70.8 kg), last menstrual period 05/30/2017.  General: NAD HEENT: normocephalic, anicteric Thyroid: no enlargement, no palpable nodules Pulmonary: No increased work of breathing, CTAB Cardiovascular: RRR Breast: Breasts symmetrical, no tenderness, no palpable nodules or masses, no skin or nipple retraction present, no nipple discharge.  No axillary or supraclavicular lymphadenopathy. Abdomen: soft, non-tender, non-distended.  Umbilicus without lesions.  No hepatomegaly, splenomegaly or masses palpable. No evidence of hernia  Genitourinary:  External: Normal external female genitalia.  Normal urethral meatus, normal   Bartholin's and Skene's glands.    Vagina: Normal vaginal mucosa, no evidence of prolapse.    Cervix: Grossly normal in appearance, no bleeding  Uterus: Non-enlarged, mobile, normal contour.  No CMT  Adnexa: ovaries non-enlarged, no adnexal masses  Rectal: deferred  Lymphatic: no evidence of inguinal lymphadenopathy Extremities: no edema, erythema, or tenderness Neurologic: Grossly intact Psychiatric: mood appropriate, affect full  Assessment: 42 y.o. G3P3003 routine annual exam, with symptoms of anxiety and depression, wishing to initiate injecable contraception.  Plan: Problem List Items Addressed This Visit      Other   Anxiety and depression   Relevant Medications   sertraline (ZOLOFT) 50 MG tablet    Other Visit Diagnoses    Encounter for annual routine gynecological examination    -  Primary   Encounter for initial prescription of injectable contraceptive       Relevant Medications   medroxyPROGESTERone (DEPO-PROVERA) 150 MG/ML injection   Pap smear for  cervical cancer screening       Relevant Orders   Pap IG and HPV (high risk) DNA detection      1) STI screening was offered and declined.  2) ASCCP guidelines and rationale discussed.  Natalie Gregory opts for every 3 year screening interval.  3) Contraception - Education given regarding options for contraception. She desires to begin DepoProvera. She has used this method in the past. Reviewed risks and benefits. Rx sent and she will return to the office for her first injection.  4) Routine healthcare maintenance including cholesterol, diabetes screening discussed: Declines.  5) Discussed interventions for anxiety and depression symptoms. She wishes to try pharmacological therapy. Risks and benefits discussed. Rx sent for sertraline. She will call as needed if therapy is ineffective once she has taken medication for 4-6 weeks.  6) Follow up in three months for medication management.     Avel Sensor, CNM 06/08/2017  7:51  PM

## 2017-06-11 LAB — PAP IG AND HPV HIGH-RISK
HPV, high-risk: NEGATIVE
PAP Smear Comment: 0

## 2017-09-27 ENCOUNTER — Emergency Department
Admission: EM | Admit: 2017-09-27 | Discharge: 2017-09-27 | Disposition: A | Discharge status: Home / Self Care | Payer: PRIVATE HEALTH INSURANCE | Attending: Emergency Medicine | Admitting: Emergency Medicine

## 2017-09-27 ENCOUNTER — Other Ambulatory Visit: Payer: Self-pay

## 2017-09-27 DIAGNOSIS — Z79899 Other long term (current) drug therapy: Secondary | ICD-10-CM | POA: Insufficient documentation

## 2017-09-27 DIAGNOSIS — F17210 Nicotine dependence, cigarettes, uncomplicated: Secondary | ICD-10-CM | POA: Diagnosis not present

## 2017-09-27 DIAGNOSIS — R946 Abnormal results of thyroid function studies: Secondary | ICD-10-CM | POA: Diagnosis not present

## 2017-09-27 DIAGNOSIS — R2243 Localized swelling, mass and lump, lower limb, bilateral: Secondary | ICD-10-CM | POA: Diagnosis not present

## 2017-09-27 DIAGNOSIS — M25473 Effusion, unspecified ankle: Secondary | ICD-10-CM

## 2017-09-27 LAB — COMPREHENSIVE METABOLIC PANEL
ALT: 64 U/L — ABNORMAL HIGH (ref 0–44)
AST: 57 U/L — ABNORMAL HIGH (ref 15–41)
Albumin: 3.6 g/dL (ref 3.5–5.0)
Alkaline Phosphatase: 65 U/L (ref 38–126)
Anion gap: 6 (ref 5–15)
BUN: 12 mg/dL (ref 6–20)
CO2: 28 mmol/L (ref 22–32)
Calcium: 8.7 mg/dL — ABNORMAL LOW (ref 8.9–10.3)
Chloride: 110 mmol/L (ref 98–111)
Creatinine, Ser: 0.57 mg/dL (ref 0.44–1.00)
GFR calc Af Amer: >60 mL/min (ref >60)
GFR calc non Af Amer: >60 mL/min (ref >60)
Glucose, Bld: 112 mg/dL — ABNORMAL HIGH (ref 70–99)
Potassium: 3.9 mmol/L (ref 3.5–5.1)
Sodium: 144 mmol/L (ref 135–145)
Total Bilirubin: 0.3 mg/dL (ref 0.3–1.2)
Total Protein: 6.5 g/dL (ref 6.5–8.1)

## 2017-09-27 LAB — CBC WITH DIFFERENTIAL/PLATELET
Basophils Absolute: 0.1 10*3/uL (ref 0–0.1)
Basophils Relative: 1 %
Eosinophils Absolute: 0.2 10*3/uL (ref 0–0.7)
Eosinophils Relative: 5 %
HCT: 29 % — ABNORMAL LOW (ref 35.0–47.0)
Hemoglobin: 9.2 g/dL — ABNORMAL LOW (ref 12.0–16.0)
Lymphocytes Relative: 33 %
Lymphs Abs: 1.4 10*3/uL (ref 1.0–3.6)
MCH: 23.5 pg — ABNORMAL LOW (ref 26.0–34.0)
MCHC: 31.9 g/dL — ABNORMAL LOW (ref 32.0–36.0)
MCV: 73.8 fL — ABNORMAL LOW (ref 80.0–100.0)
Monocytes Absolute: 0.6 10*3/uL (ref 0.2–0.9)
Monocytes Relative: 14 %
Neutro Abs: 2 10*3/uL (ref 1.4–6.5)
Neutrophils Relative %: 47 %
Platelets: 249 10*3/uL (ref 150–440)
RBC: 3.93 MIL/uL (ref 3.80–5.20)
RDW: 18.4 % — ABNORMAL HIGH (ref 11.5–14.5)
WBC: 4.3 10*3/uL (ref 3.6–11.0)

## 2017-09-27 LAB — TSH: TSH: <0.01 u[IU]/mL — ABNORMAL LOW (ref 0.350–4.500)

## 2017-09-27 LAB — BRAIN NATRIURETIC PEPTIDE: B Natriuretic Peptide: 226 pg/mL — ABNORMAL HIGH (ref 0.0–100.0)

## 2017-09-27 LAB — POCT PREGNANCY, URINE: Preg Test, Ur: NEGATIVE

## 2017-09-27 NOTE — ED Triage Notes (Signed)
States foot swelling x 1 week, went down and now again. No other complaints

## 2017-09-27 NOTE — ED Provider Notes (Signed)
[facility] Emergency Department Provider Note  ____________________________________________  Time seen: Approximately 4:25 PM  I have reviewed the triage vital signs and the nursing notes.   HISTORY  Chief Complaint Foot Swelling    HPI Natalie Gregory is a 42 y.o. female presents to the emergency department with bilateral pitting edema of the ankles that patient has noticed for the past week.  Patient denies falls or mechanisms of trauma.  She reports increased physical activity this week.  She denies shortness of breath, cough, increased pillow usage at night or history of CHF.  Patient denies a history of renal issues.  She denies possibility of pregnancy.  Patient has never been diagnosed with thyroid disease in the past.  She has had palpitations and mild headaches but no constipation, diarrhea, fatigue, hair loss, insomnia or changes in weight. No alleviating measures have been attempted.    Past Medical History:  Diagnosis Date  . Atypical squamous cell changes of undetermined significance (ASCUS) on cervical cytology with negative high risk human papilloma virus (HPV) test result   . 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 Trichomonal vulvovaginitis     Patient Active Problem List   Diagnosis Date Noted  . Anxiety and depression 06/06/2017    Past Surgical History:  Procedure Laterality Date  . CESAREAN SECTION      Prior to Admission medications   Medication Sig Start Date End Date Taking? Authorizing Provider  amoxicillin (AMOXIL) 500 MG capsule Take 1 capsule (500 mg total) by mouth 3 (three) times daily. Patient not taking: Reported on 06/06/2017 04/19/17   Paulette Blanch, MD  azithromycin (ZITHROMAX) 250 MG tablet Take 1 tablet (250 mg total) by mouth daily. Patient not taking: Reported on 06/06/2017 04/19/17   Paulette Blanch, MD  cyclobenzaprine (FLEXERIL) 10 MG tablet Take 1 tablet (10 mg total) by mouth 3  (three) times daily as needed for muscle spasms. Patient not taking: Reported on 06/06/2017 04/20/17   Versie Starks, PA-C  medroxyPROGESTERone (DEPO-PROVERA) 150 MG/ML injection Inject 1 mL (150 mg total) into the muscle every 3 (three) months. 06/06/17   Rexene Agent, CNM  metaxalone (SKELAXIN) 800 MG tablet Take 800 mg by mouth 3 (three) times daily as needed for muscle spasms.    [provider]  predniSONE (STERAPRED UNI-PAK 21 TAB) 10 MG (21) TBPK tablet Take 6 pills on day one then decrease by 1 pill each day Patient not taking: Reported on 06/06/2017 04/20/17   Versie Starks, PA-C  sertraline (ZOLOFT) 50 MG tablet Take 1 tablet (50 mg total) by mouth daily. 06/06/17   Rexene Agent, CNM    Allergies Patient has no known allergies.  Family History  Problem Relation Age of Onset  . Hypertension Mother   . Diabetes Brother   . Diabetes Maternal Aunt   . Hypertension Maternal Aunt   . Diabetes Maternal Uncle   . Hypertension Maternal Uncle     Social History Social History   Tobacco Use  . Smoking status: Current Every Day Smoker    Packs/day: 0.50    Types: Cigarettes  . Smokeless tobacco: Never Used  Substance Use Topics  . Alcohol use: No  . Drug use: No     Review of Systems  Constitutional: No fever/chills Eyes: No visual changes. No discharge ENT: No upper respiratory complaints. Cardiovascular: no chest pain. Respiratory: no cough. No SOB. Gastrointestinal: No abdominal pain.  No nausea, no vomiting.  No diarrhea.  No constipation. Genitourinary: Negative for dysuria. No hematuria Musculoskeletal: Negative for musculoskeletal pain. Skin: Patient has bilateral ankle swelling.  Neurological: Negative for headaches, focal weakness or numbness.   ____________________________________________   PHYSICAL EXAM:  VITAL SIGNS: ED Triage Vitals  Enc Vitals Group     BP 09/27/17 1452 (!) 143/75     Pulse Rate 09/27/17 1452 100     Resp  09/27/17 1452 16     Temp 09/27/17 1452 98.7 F (37.1 C)     Temp Source 09/27/17 1452 Oral     SpO2 09/27/17 1452 98 %     Weight 09/27/17 1453 150 lb (68 kg)     Height 09/27/17 1453 5\' 4"  (1.626 m)     Head Circumference --      Peak Flow --      Pain Score 09/27/17 1452 0     Pain Loc --      Pain Edu? --      Excl. in [place]? --      Constitutional: Alert and oriented. Well appearing and in no acute distress. Eyes: Conjunctivae are normal. PERRL. EOMI. Head: Atraumatic. ENT:      Ears: TMs are pearly.       Nose: No congestion/rhinnorhea.      Mouth/Throat: Mucous membranes are moist.  Mild thyroid enlargement appreciated.  Neck: No stridor.  No cervical spine tenderness to palpation. Hematological/Lymphatic/Immunilogical: No cervical lymphadenopathy.  Cardiovascular: Normal rate, regular rhythm. Normal S1 and S2.  Good peripheral circulation. Respiratory: Normal respiratory effort without tachypnea or retractions. Lungs CTAB. Good air entry to the bases with no decreased or absent breath sounds. Gastrointestinal: Bowel sounds 4 quadrants. Soft and nontender to palpation. No guarding or rigidity. No palpable masses. No distention. No CVA tenderness. Musculoskeletal: Full range of motion to all extremities. No gross deformities appreciated. Neurologic:  Normal speech and language. No gross focal neurologic deficits are appreciated.  Skin: Patient has 2+ pitting edema at the ankles bilaterally.  Psychiatric: Mood and affect are normal. Speech and behavior are normal. Patient exhibits appropriate insight and judgement.   ____________________________________________   LABS (all labs ordered are listed, but only abnormal results are displayed)  Labs Reviewed  CBC WITH DIFFERENTIAL/PLATELET - Abnormal; Notable for the following components:      Result Value   Hemoglobin 9.2 (*)    HCT 29.0 (*)    MCV 73.8 (*)    MCH 23.5 (*)    MCHC 31.9 (*)    RDW 18.4 (*)    All other  components within normal limits  COMPREHENSIVE METABOLIC PANEL - Abnormal; Notable for the following components:   Glucose, Bld 112 (*)    Calcium 8.7 (*)    AST 57 (*)    ALT 64 (*)    All other components within normal limits  TSH - Abnormal; Notable for the following components:   TSH <0.010 (*)    All other components within normal limits  BRAIN NATRIURETIC PEPTIDE - Abnormal; Notable for the following components:   B Natriuretic Peptide 226.0 (*)    All other components within normal limits  T3, FREE  POC URINE PREG, ED  POCT PREGNANCY, URINE   ____________________________________________  EKG   ____________________________________________  RADIOLOGY   No results found.  ____________________________________________    PROCEDURES  Procedure(s) performed:    Procedures    Medications - No data to display   ____________________________________________   INITIAL IMPRESSION /  ASSESSMENT AND PLAN / ED COURSE  Pertinent labs & imaging results that were available during my care of the patient were reviewed by me and considered in my medical decision making (see chart for details).  Review of the [place] CSRS was performed in accordance of the [place] prior to dispensing any controlled drugs.      Assessment and Plan:  Bilateral ankle swelling Thyroid abnormality Patient presents to the emergency department with bilateral ankle swelling for approximately 1 week.  Differential diagnosis included CHF, renal insufficiency, idiopathic lower leg edema and hyperthyroidism.  On physical exam, patient had 2+ pitting edema bilaterally.  Patient adamantly denies shortness of breath, chest tightness, chest pain, increased pillow usage or prior history of CHF.  BNP was in the 200s, decreasing suspicion for acute CHF.  Creatinine and BUN were within reference range, decreasing suspicion for renal insufficiency.  Patient's TSH returned at 0.01, significantly lower than reference  range.  Patient admitted that within the last "several" months patient has lost approximately 20 pounds.  She also occasionally experiences palpitations and headaches, increasing suspicion for hyperthyroidism.  A free T3 was obtained which is in process at this time.  Patient was referred to her primary care provider.  I recommended further work-up for hyperthyroidism.  Patient voiced understanding.  No medications were prescribed at discharge until patient has appropriate work-up either with primary care or endocrinology.  Vital signs are reassuring prior to discharge.    ____________________________________________  FINAL CLINICAL IMPRESSION(S) / ED DIAGNOSES  Final diagnoses:  Thyroid function study abnormality  Ankle swelling, unspecified laterality      NEW MEDICATIONS STARTED DURING THIS VISIT:  ED Discharge Orders    None          This chart was dictated using voice recognition software/Dragon. Despite best efforts to proofread, errors can occur which can change the meaning. Any change was purely unintentional.    Lannie Fields, PA-C 09/27/17 1745    Hinda Kehr, MD 09/27/17 2015

## 2017-09-27 NOTE — ED Notes (Signed)
Pt has bilateral foot swelling, on and off x 1 week. Denies pain, denies cough.

## 2017-09-27 NOTE — ED Notes (Signed)
Started a new job 3 months ago and is on her feet a lot. Also has rash on the inner aspect of elbows

## 2017-09-29 ENCOUNTER — Telehealth: Payer: Self-pay | Admitting: Emergency Medicine

## 2017-09-29 LAB — T3, FREE: T3, Free: 16 pg/mL — ABNORMAL HIGH (ref 2.0–4.4)

## 2017-09-29 NOTE — Telephone Encounter (Addendum)
Late results for T3 are complete.  Called patient to ask about follow up plans.  Left message  Patient called me today 10/03/17 and left message.  I called back and left her a message. I gave my number, but also said to call her pcp.

## 2017-10-27 DIAGNOSIS — I159 Secondary hypertension, unspecified: Secondary | ICD-10-CM | POA: Insufficient documentation

## 2017-10-31 DIAGNOSIS — E0500 Thyrotoxicosis with diffuse goiter without thyrotoxic crisis or storm: Secondary | ICD-10-CM | POA: Insufficient documentation

## 2018-05-04 ENCOUNTER — Telehealth: Payer: Self-pay

## 2018-05-04 NOTE — Telephone Encounter (Signed)
Pt calling for rx refill.  [phone]  Left msg to call with name of medication.

## 2018-05-14 NOTE — Telephone Encounter (Signed)
Pt hasn't returned call.  Msg closed. 

## 2018-06-17 ENCOUNTER — Ambulatory Visit: Payer: Self-pay | Admitting: Obstetrics and Gynecology

## 2019-02-09 IMAGING — CR DG CHEST 2V
1 series · 2 of 2 positions shown · non-contrast
Comparison: Chest radiograph performed 02/27/2016

CLINICAL DATA: Acute onset of central chest tightness and right
shoulder pain.

EXAM:
CHEST - 2 VIEW

[Series 1: w chest pa · 0.14mm/px · 2 of 2 slices shown]
[im 1/2]
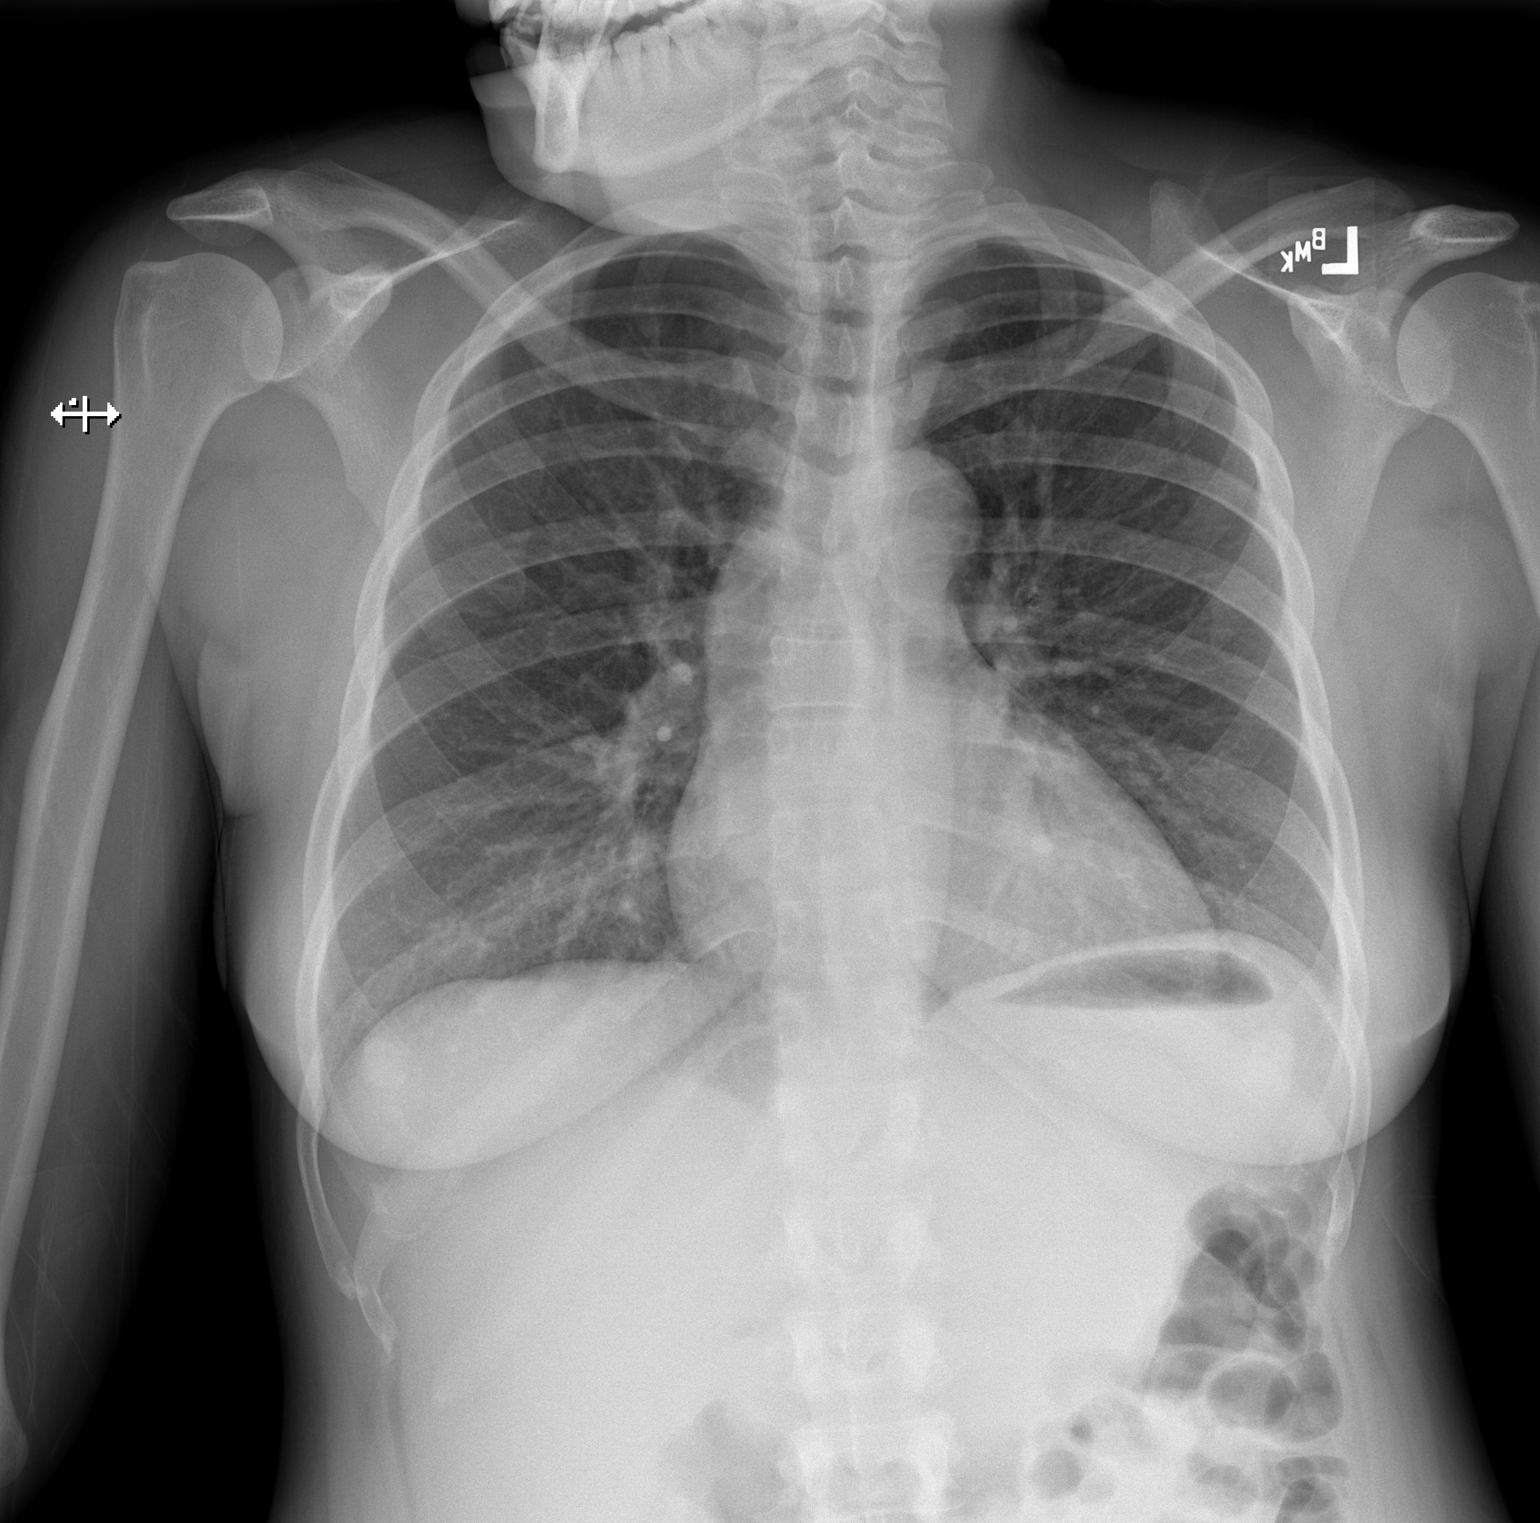
[im 2/2]
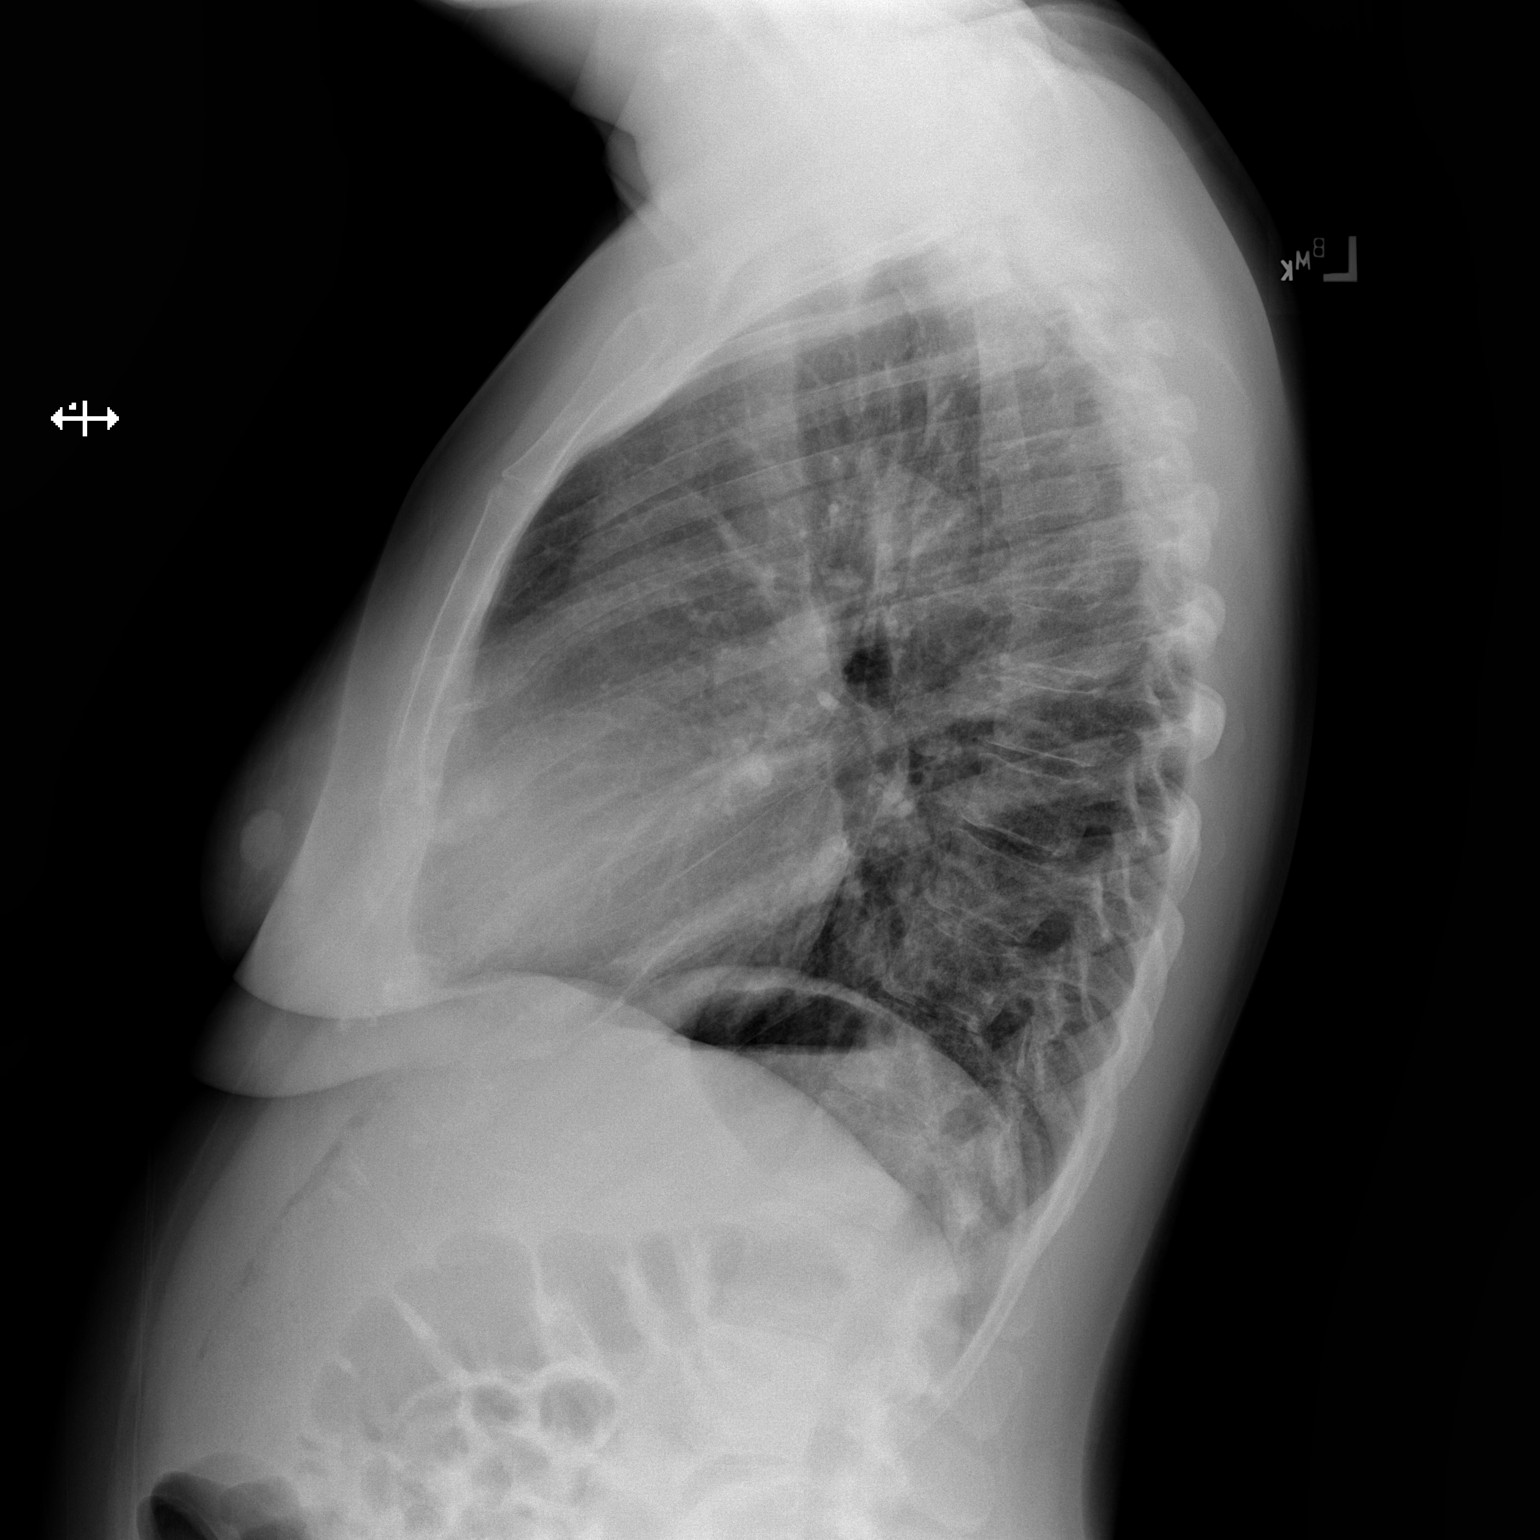

[2 of 2 positions shown; findings below may reference images not displayed]

FINDINGS: The lungs are well-aerated. Minimal right basilar opacity may
reflect mild interstitial edema. There is no evidence of pleural
effusion or pneumothorax.

The heart is normal in size; the mediastinal contour is within
normal limits. No acute osseous abnormalities are seen.
IMPRESSION: Minimal right basilar opacity may reflect mild interstitial edema.
Lungs otherwise clear.

## 2019-02-09 IMAGING — CT CT ANGIO CHEST
2 of 7 series · 18 of 46 positions shown · IV contrast (APPLIED)
Comparison: Chest radiograph performed earlier today at [DATE] a.m.

CLINICAL DATA: Acute onset of central chest tightness. Subacute
onset of right shoulder pain. Shortness of breath.

EXAM:
CT ANGIOGRAPHY CHEST WITH CONTRAST
TECHNIQUE: Multidetector CT imaging of the chest was performed using the
standard protocol during bolus administration of intravenous
contrast. Multiplanar CT image reconstructions and MIPs were
obtained to evaluate the vascular anatomy.
CONTRAST:  75mL [id number] IOPAMIDOL ([id number]) INJECTION 76%

[Series 6: thins · axial · 0.63mm/px · z∈[-720,-496]mm · 15 of 252 slices shown]
[im 14/252  lung]
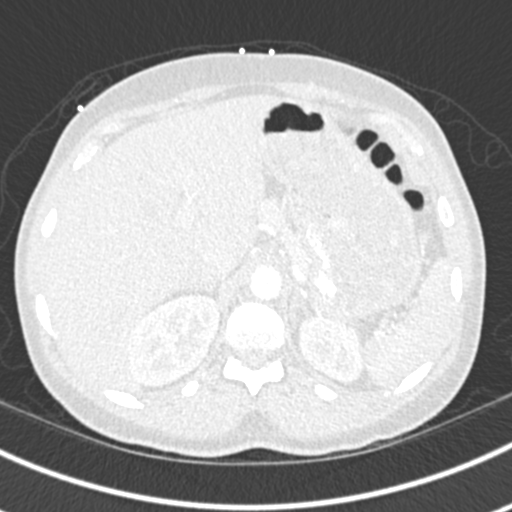
[im 27/252  soft-tissue]
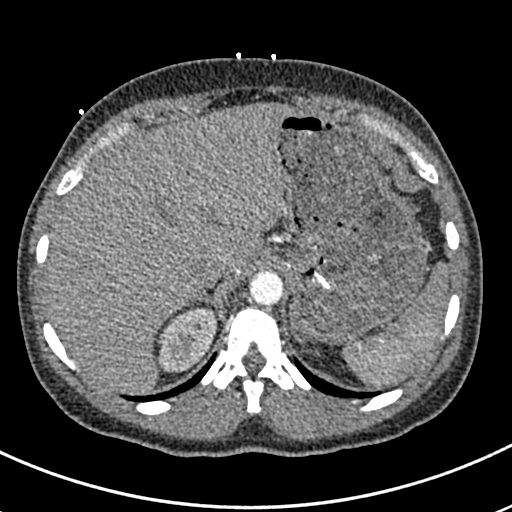
[im 53/252  lung]
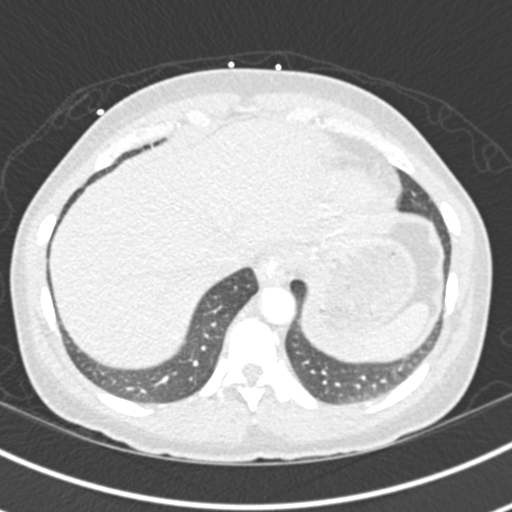
[im 67/252  soft-tissue]
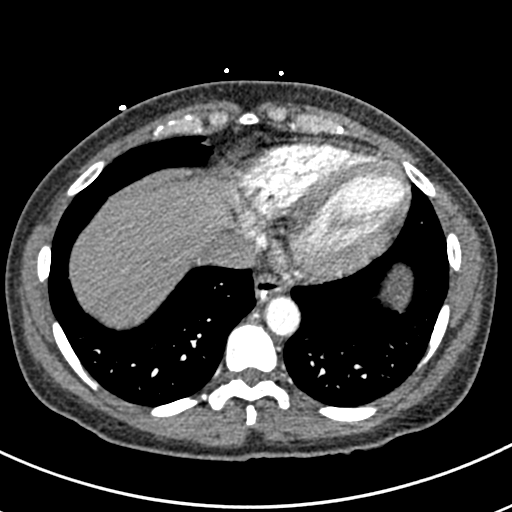
[im 80/252  lung]
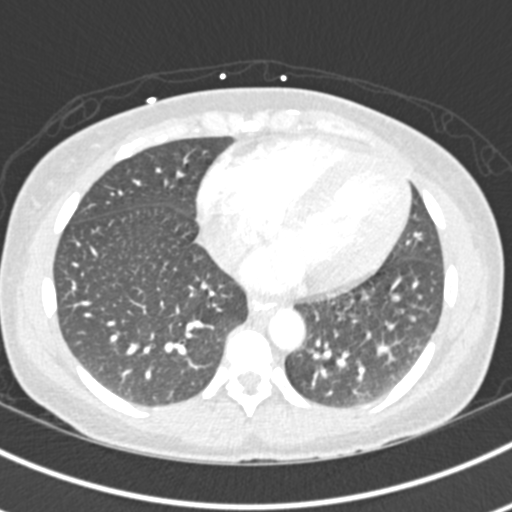
[im 93/252  soft-tissue]
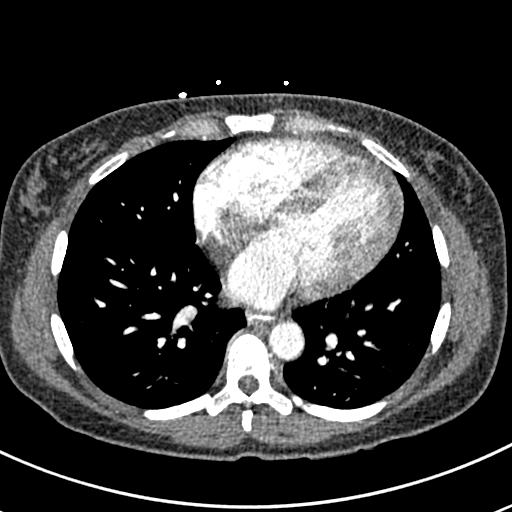
[im 106/252  lung]
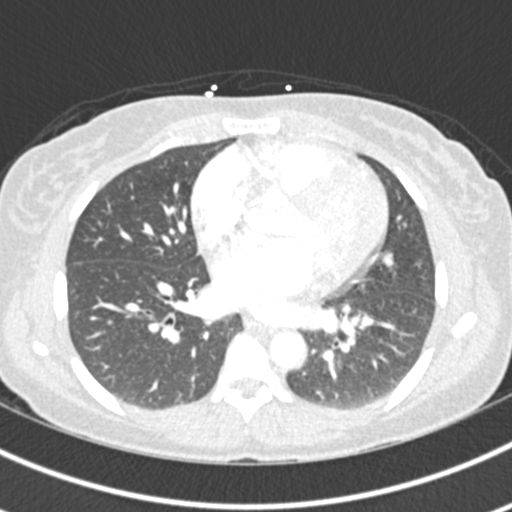
[im 133/252  soft-tissue]
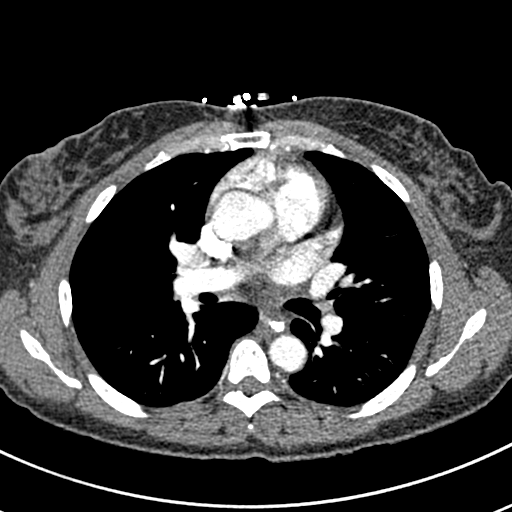
[im 146/252  lung]
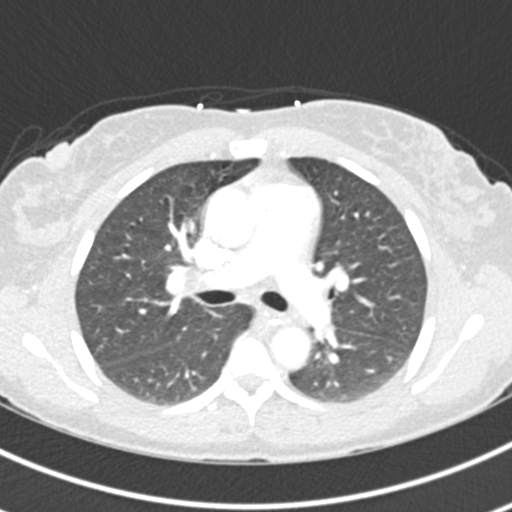
[im 159/252  soft-tissue]
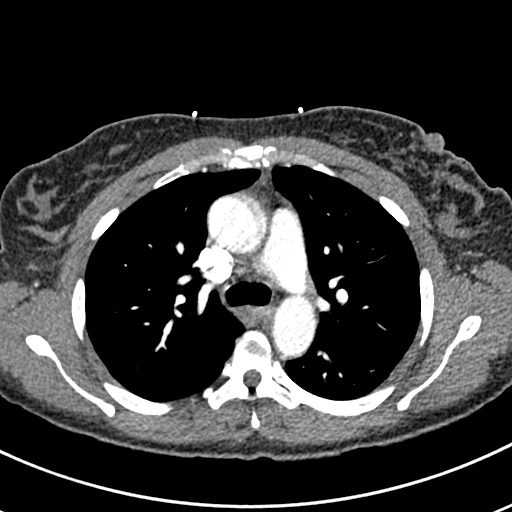
[im 172/252  lung]
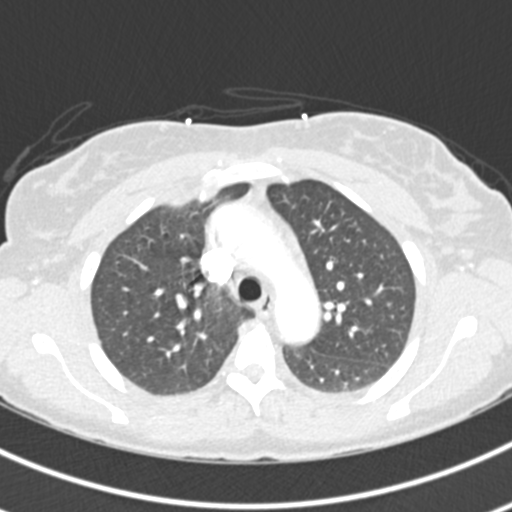
[im 185/252  soft-tissue]
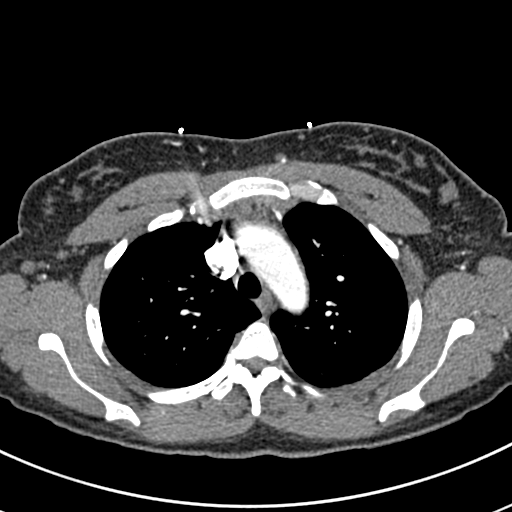
[im 212/252  lung]
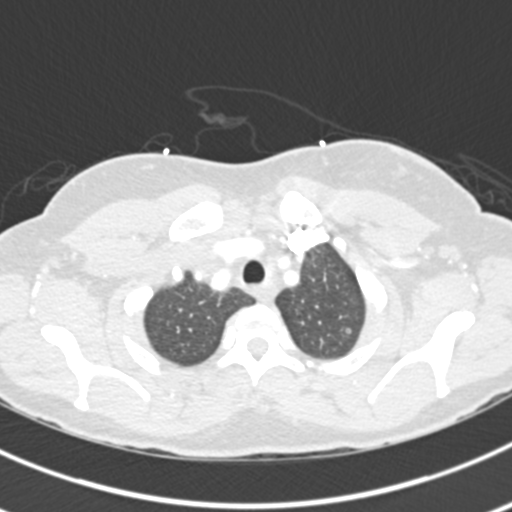
[im 225/252  soft-tissue]
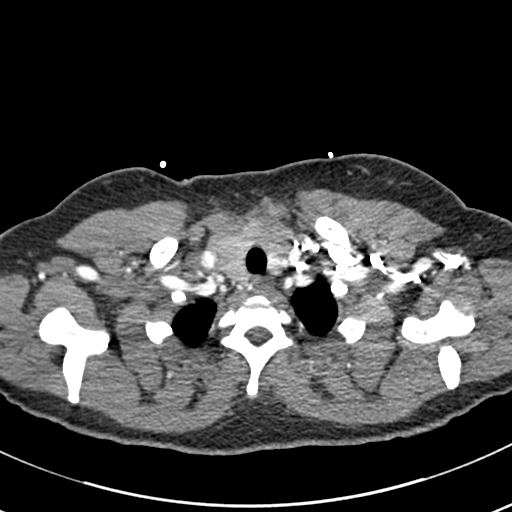
[im 238/252  lung]
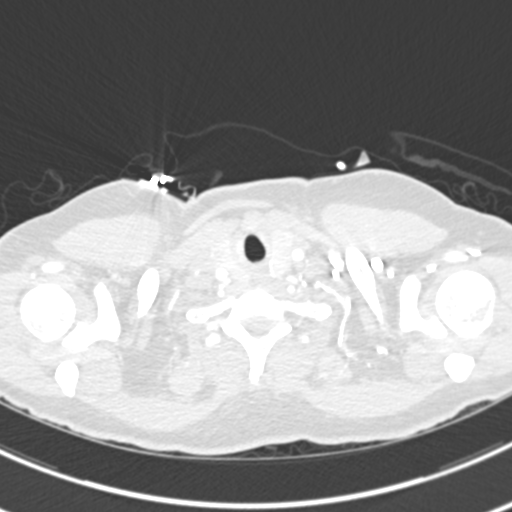

[Series 8: coronal mpr · coronal · 0.53mm/px · 3 of 76 slices shown]
[im 19/76  soft-tissue]
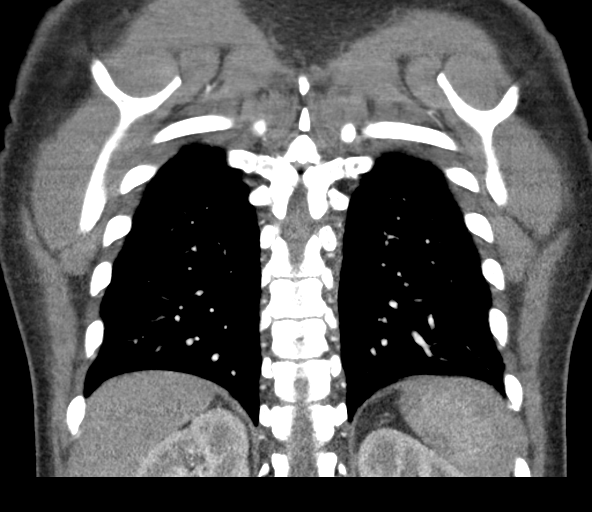
[im 38/76  soft-tissue]
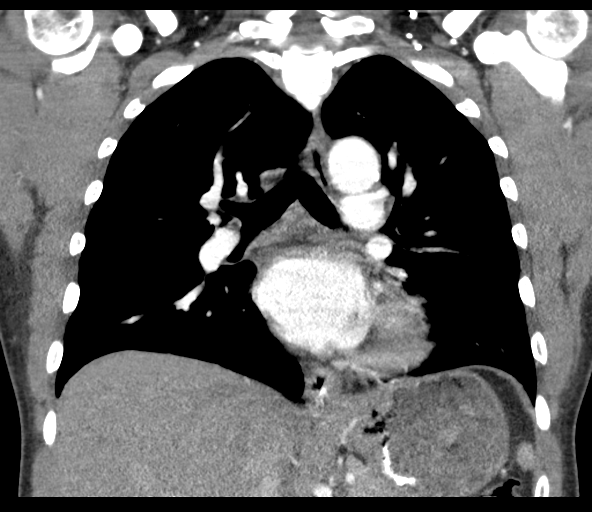
[im 57/76  soft-tissue]
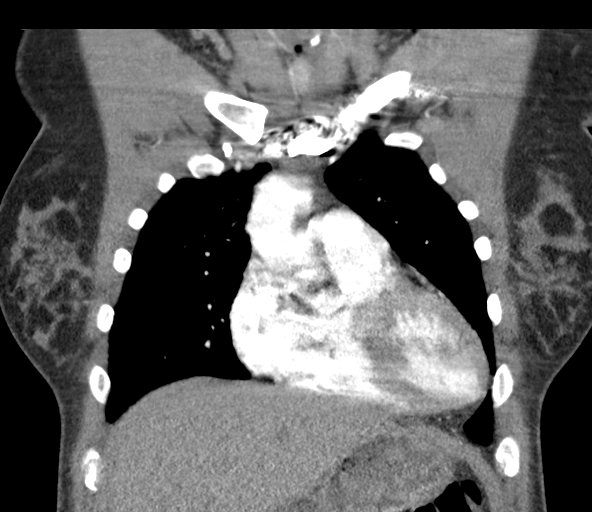

[18 of 46 positions shown; findings below may reference images not displayed]

FINDINGS: Cardiovascular:  There is no evidence of pulmonary embolus.

The heart is normal in size. The thoracic aorta is unremarkable. The
great vessels are within normal limits.

Mediastinum/Nodes: The mediastinum is unremarkable in appearance. No
mediastinal lymphadenopathy is seen. No pericardial effusion is
identified. Residual contrast is seen within the esophagus.

The thyroid gland is unremarkable in appearance. No axillary
lymphadenopathy is seen.

Lungs/Pleura: A few small ring-shaped airspace opacities are noted
at the lung apices bilaterally, raising question for underlying
atypical infection or inflammatory condition.

No pleural effusion or pneumothorax is seen. No masses are
identified.

Upper Abdomen: The visualized portions of the liver and spleen are
unremarkable. The visualized portions of the pancreas, adrenal
glands and kidneys are within normal limits.

Musculoskeletal: No acute osseous abnormalities are identified. The
visualized musculature is unremarkable in appearance.

Review of the MIP images confirms the above findings.
IMPRESSION: 1. No evidence of pulmonary embolus.
2. Few small ring-shaped airspace opacities at the lung apices
bilaterally. This is an unusual appearance, and raises question for
an underlying atypical infection or inflammatory condition. Would
correlate with the patient's symptoms and lab findings.

## 2019-04-21 ENCOUNTER — Other Ambulatory Visit (HOSPITAL_COMMUNITY)
Admission: RE | Admit: 2019-04-21 | Discharge: 2019-04-21 | Disposition: A | Discharge status: Home / Self Care | Payer: PRIVATE HEALTH INSURANCE | Source: Ambulatory Visit | Attending: Obstetrics and Gynecology | Admitting: Obstetrics and Gynecology

## 2019-04-21 ENCOUNTER — Ambulatory Visit (INDEPENDENT_AMBULATORY_CARE_PROVIDER_SITE_OTHER): Payer: PRIVATE HEALTH INSURANCE | Admitting: Obstetrics and Gynecology

## 2019-04-21 ENCOUNTER — Encounter: Payer: Self-pay | Admitting: Obstetrics and Gynecology

## 2019-04-21 ENCOUNTER — Other Ambulatory Visit: Payer: Self-pay

## 2019-04-21 VITALS — BP 157/96 | HR 73 | Ht 64.0 in | Wt 165.0 lb

## 2019-04-21 DIAGNOSIS — Z01419 Encounter for gynecological examination (general) (routine) without abnormal findings: Secondary | ICD-10-CM

## 2019-04-21 DIAGNOSIS — Z113 Encounter for screening for infections with a predominantly sexual mode of transmission: Secondary | ICD-10-CM

## 2019-04-21 DIAGNOSIS — Z1239 Encounter for other screening for malignant neoplasm of breast: Secondary | ICD-10-CM

## 2019-04-21 NOTE — Patient Instructions (Signed)
[facility] [address] [place] [address]  [facility] [place]  [address] [place] [address]  Phone: [phone]  

## 2019-04-21 NOTE — Progress Notes (Signed)
Gynecology Annual Exam  PCP: [facility] [place]  Chief Complaint:  Chief Complaint  Patient presents with  . Gynecologic Exam    History of Present Illness: Patient is a 44 y.o. G3P3003 presents for annual exam. The patient has no complaints today.   LMP: Patient's last menstrual period was 03/24/2019 (exact date). Average Interval: regular, 28 days Duration of flow: 5 days Heavy Menses: no Clots: no Intermenstrual Bleeding: no Postcoital Bleeding: no Dysmenorrhea: no   The patient is sexually active. She currently uses none for contraception. She denies dyspareunia.  The patient does perform self breast exams.  There is no notable family history of breast or ovarian cancer in her family.  The patient wears seatbelts: yes.   The patient has regular exercise: not asked.    The patient denies current symptoms of depression.    Review of Systems: ROS  Past Medical History:  Patient Active Problem List   Diagnosis Date Noted  . Anxiety and depression 06/06/2017    Past Surgical History:  Past Surgical History:  Procedure Laterality Date  . CESAREAN SECTION      Gynecologic History:  Patient's last menstrual period was 03/24/2019 (exact date). Contraception: none Last Pap: Results were: 06/06/2017 NIL and HR HPV negative    Obstetric History: [id number]  Family History:  Family History  Problem Relation Age of Onset  . Hypertension Mother   . Diabetes Brother   . Diabetes Maternal Aunt   . Hypertension Maternal Aunt   . Diabetes Maternal Uncle   . Hypertension Maternal Uncle     Social History:  Social History   Socioeconomic History  . Marital status: Single    Spouse name: Not on file  . Number of children: Not on file  . Years of education: Not on file  . Highest education level: Not on file  Occupational History  . Not on file  Tobacco Use  . Smoking status: Current Every Day Smoker    Packs/day: 0.50    Types: Cigarettes    . Smokeless tobacco: Never Used  Substance and Sexual Activity  . Alcohol use: No  . Drug use: No  . Sexual activity: Yes    Birth control/protection: None  Other Topics Concern  . Not on file  Social History Narrative  . Not on file   Social Determinants of Health   Financial Resource Strain:   . Difficulty of Paying Living Expenses: Not on file  Food Insecurity:   . Worried About Charity fundraiser in the Last Year: Not on file  . Ran Out of Food in the Last Year: Not on file  Transportation Needs:   . Lack of Transportation (Medical): Not on file  . Lack of Transportation (Non-Medical): Not on file  Physical Activity:   . Days of Exercise per Week: Not on file  . Minutes of Exercise per Session: Not on file  Stress:   . Feeling of Stress : Not on file  Social Connections:   . Frequency of Communication with Friends and Family: Not on file  . Frequency of Social Gatherings with Friends and Family: Not on file  . Attends Religious Services: Not on file  . Active Member of Clubs or Organizations: Not on file  . Attends Archivist Meetings: Not on file  . Marital Status: Not on file  Intimate Partner Violence:   . Fear of Current or Ex-Partner: Not on file  . Emotionally Abused: Not on  file  . Physically Abused: Not on file  . Sexually Abused: Not on file    Allergies:  No Known Allergies  Medications: Prior to Admission medications   Medication Sig Start Date End Date Taking? Authorizing Provider  methimazole (TAPAZOLE) 5 MG tablet SMARTSIG:3 Tablet(s) By Mouth Every Other Day PRN 01/18/19  Yes [provider]  metoprolol succinate (TOPROL-XL) 50 MG 24 hr tablet Take 50 mg by mouth daily. 12/21/18  Yes [provider]    Physical Exam Vitals: Blood pressure (!) 157/96, pulse 73, height 5\' 4"  (1.626 m), weight 165 lb (74.8 kg), last menstrual period 03/24/2019.  General: NAD HEENT: normocephalic, anicteric Thyroid: no enlargement, no  palpable nodules Pulmonary: No increased work of breathing, CTAB Cardiovascular: RRR, distal pulses 2+ Breast: Breast symmetrical, no tenderness, no palpable nodules or masses, no skin or nipple retraction present, no nipple discharge.  No axillary or supraclavicular lymphadenopathy. Abdomen: NABS, soft, non-tender, non-distended.  Umbilicus without lesions.  No hepatomegaly, splenomegaly or masses palpable. No evidence of hernia  Genitourinary:  External: Normal external female genitalia.  Normal urethral meatus, normal Bartholin's and Skene's glands.    Vagina: Normal vaginal mucosa, no evidence of prolapse.    Cervix: Grossly normal in appearance, no bleeding  Uterus: Non-enlarged, mobile, normal contour.  No CMT  Adnexa: ovaries non-enlarged, no adnexal masses  Rectal: deferred  Lymphatic: no evidence of inguinal lymphadenopathy Extremities: no edema, erythema, or tenderness Neurologic: Grossly intact Psychiatric: mood appropriate, affect full  Female chaperone present for pelvic and breast  portions of the physical exam    Assessment: 44 y.o. G3P3003 routine annual exam  Plan: Problem List Items Addressed This Visit    None    Visit Diagnoses    Encounter for gynecological examination without abnormal finding    -  Primary   Breast screening       Relevant Orders   MM 3D SCREEN BREAST BILATERAL   Routine screening for STI (sexually transmitted infection)       Relevant Orders   HEP, RPR, HIV Panel   Cervicovaginal ancillary only      1) Mammogram - recommend yearly screening mammogram.  Mammogram Is up to date   2) STI screening  wasoffered and accepted  3) ASCCP guidelines and rational discussed.  Patient opts for every 3 years screening interval  4) Contraception - the patient is currently using  none.  She is happy with her current form of contraception and plans to continue  5) Colonoscopy -- Screening recommended starting at age 85 for average risk  individuals, age 31 for individuals deemed at increased risk (including African Americans) and recommended to continue until age 103.  For patient age 42-85 individualized approach is recommended.  Gold standard screening is via colonoscopy, Cologuard screening is an acceptable alternative for patient unwilling or unable to undergo colonoscopy.  "Colorectal cancer screening for average?risk adults: 2018 guideline update from the American Cancer Society"CA: A Cancer Journal for Clinicians: Jul 10, 2016   6) Routine healthcare maintenance including cholesterol, diabetes screening discussed managed by PCP  7) Return in about 1 year (around 04/20/2020) for annual.   Malachy Mood, MD, [facility], [facility] 04/21/2019, 3:10 PM

## 2019-04-22 LAB — HEP, RPR, HIV PANEL
HIV Screen 4th Generation wRfx: NONREACTIVE
Hepatitis B Surface Ag: NEGATIVE
RPR Ser Ql: REACTIVE — AB

## 2019-04-22 LAB — RPR, QUANT. (REFLEX): Rapid Plasma Reagin, Quant: 1:1 {titer} — ABNORMAL HIGH

## 2019-04-23 LAB — CERVICOVAGINAL ANCILLARY ONLY
Chlamydia: NEGATIVE
Comment: NEGATIVE
Comment: NEGATIVE
Comment: NORMAL
Neisseria Gonorrhea: NEGATIVE
Trichomonas: NEGATIVE

## 2019-04-26 ENCOUNTER — Other Ambulatory Visit: Payer: Self-pay | Admitting: Obstetrics and Gynecology

## 2019-04-26 ENCOUNTER — Telehealth: Payer: Self-pay

## 2019-04-26 DIAGNOSIS — A530 Latent syphilis, unspecified as early or late: Secondary | ICD-10-CM

## 2019-04-26 NOTE — Telephone Encounter (Signed)
Natalie Gregory w/[facility] Calling to inquire about reactive RPR and needs TPA (confirmatory test)results.

## 2019-04-26 NOTE — Telephone Encounter (Signed)
Patient is schedule for 04/30/19 for lab

## 2019-04-26 NOTE — Telephone Encounter (Signed)
Needs follow up lab test order placed patient aware

## 2019-04-27 NOTE — Telephone Encounter (Signed)
Patient notified 04/30/2019 lab apt shouldn't be necessary as [facility] keeps blood for 7 days. The requested test order has been sent in and confirmed as an add on test per [facility] in house [place]. We will keep apt until results have been received.

## 2019-04-30 ENCOUNTER — Other Ambulatory Visit: Payer: PRIVATE HEALTH INSURANCE

## 2019-04-30 ENCOUNTER — Other Ambulatory Visit: Payer: Self-pay | Admitting: Obstetrics and Gynecology

## 2019-04-30 LAB — T.PALLIDUM AB, TOTAL: T Pallidum Abs: REACTIVE — AB

## 2019-04-30 LAB — SPECIMEN STATUS REPORT

## 2019-04-30 NOTE — Telephone Encounter (Signed)
So I have made an appointment for her 05/03/2019 at 0900 at at [place],  I was unable to reach the patient or leave a message as her voicemailbox is full

## 2019-04-30 NOTE — Progress Notes (Signed)
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

## 2019-04-30 NOTE — Telephone Encounter (Signed)
Spoke w/patient. Notified confirmatory test result was also reactive. Relayed [facility] apt info. Patient has questions.

## 2019-04-30 NOTE — Telephone Encounter (Signed)
Natalie Gregory w/[facility] calling to f/u on Reactive TPA. Inquiring what course of treatment will be.

## 2019-05-03 ENCOUNTER — Other Ambulatory Visit: Payer: Self-pay

## 2019-05-03 ENCOUNTER — Encounter: Payer: Self-pay | Admitting: Family Medicine

## 2019-05-03 ENCOUNTER — Ambulatory Visit: Payer: Self-pay | Admitting: Family Medicine

## 2019-05-03 DIAGNOSIS — A528 Late syphilis, latent: Secondary | ICD-10-CM

## 2019-05-03 DIAGNOSIS — Z113 Encounter for screening for infections with a predominantly sexual mode of transmission: Secondary | ICD-10-CM

## 2019-05-03 DIAGNOSIS — A539 Syphilis, unspecified: Secondary | ICD-10-CM

## 2019-05-03 MED ORDER — PENICILLIN G BENZATHINE 2400000 UNIT/4ML IM SUSP
2.4000 10*6.[IU] | INTRAMUSCULAR | Status: AC
  Administered 2019-05-03 – 2019-05-18 (×3): 2400000 [IU] via INTRAMUSCULAR

## 2019-05-03 NOTE — Progress Notes (Signed)
[facility] Department STI clinic/screening visit  Subjective:  Natalie Gregory is a 44 y.o. female being seen today for an STI screening visit.  Reports positive RPR with OB/GYN. Reviewed in epic and had pos RPR with pos T.pallidum ab.   Reviewed [facility] and patient never seen here before  Patient does not remember being tested in the past, definitely has not been tested in the last year. Denies ever being treated for syphilis in the past.  Denies sx today. Denies seeing a lesion in the past year. Has only one partner-- her child's father. She has not been told she was contact to syphilis.   Patient has the following medical conditions:   Patient Active Problem List   Diagnosis Date Noted  . 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 Anxiety and depression 06/06/2017    Chief Complaint  Patient presents with  . SEXUALLY TRANSMITTED DISEASE    HPI  Patient reports positive RPR at recent GYN visit on 3/10.   See flowsheet for further details and programmatic requirements.    The following portions of the patient's history were reviewed and updated as appropriate: allergies, current medications, past medical history, past social history, past surgical history and problem list.  Objective:  There were no vitals filed for this visit.  Physical Exam- declines exam today   Assessment and Plan:  Natalie Gregory is a 44 y.o. female presenting to the [facility] Department for STI screening  1. Syphilis - Penicillin G Benzathine SUSP 2,400,000 Units  2. Screening examination for venereal disease Declines exam  3. Late latent syphilis Patient reports no recent exposures, lesions, unknown time course - Will treat as latent as this is likely > 71yr since exposure - Counseled on need for 3 treatment shots and follow up titers though it should be noted that her current titer is only 1:1.  - Penicillin G Benzathine SUSP 2,400,000 Units   Return in about 1 week (around  05/10/2019) for Syphyllis treatment.  No future appointments.  Caren Macadam, MD

## 2019-05-03 NOTE — Progress Notes (Signed)
Treated for Syphilis per provider orders. Next scheduled appt given for second dose. Hal Morales, RN

## 2019-05-03 NOTE — Progress Notes (Signed)
Here today for "my penicillin." Patient states she was scheduled this appt from PCP and needs medicine for Syphilis. Declines exam. Hal Morales, RN

## 2019-05-03 NOTE — Telephone Encounter (Signed)
Notified Georgina Snell of Reactive confirmatory test and patient was scheduled to be seen at [facility] this morning at 9 am.

## 2019-05-10 ENCOUNTER — Ambulatory Visit: Payer: Self-pay

## 2019-05-10 ENCOUNTER — Other Ambulatory Visit: Payer: Self-pay

## 2019-05-10 DIAGNOSIS — A528 Late syphilis, latent: Secondary | ICD-10-CM

## 2019-05-10 DIAGNOSIS — A539 Syphilis, unspecified: Secondary | ICD-10-CM

## 2019-05-11 NOTE — Progress Notes (Signed)
Patient tx'd per Dr. Ernestina Patches order for syhpilis; tolerated well. Patient will return next Monday for last injections.  Aileen Fass, RN

## 2019-05-17 ENCOUNTER — Ambulatory Visit: Payer: Self-pay

## 2019-05-18 ENCOUNTER — Ambulatory Visit: Payer: Self-pay

## 2019-05-18 ENCOUNTER — Other Ambulatory Visit: Payer: Self-pay

## 2019-05-18 ENCOUNTER — Telehealth: Payer: Self-pay

## 2019-05-18 DIAGNOSIS — A539 Syphilis, unspecified: Secondary | ICD-10-CM

## 2019-05-18 DIAGNOSIS — A528 Late syphilis, latent: Secondary | ICD-10-CM

## 2019-05-18 NOTE — Progress Notes (Signed)
Patient tx'd with Bicillin per [facility] order. Tolerated well. Patient instructed to f/u in 6 months and 12 months. Aileen Fass, RN

## 2019-05-21 NOTE — Telephone Encounter (Signed)
Patient treated 05/18/19 Aileen Fass, RN

## 2019-08-17 ENCOUNTER — Emergency Department
Admission: EM | Admit: 2019-08-17 | Discharge: 2019-08-17 | Disposition: A | Discharge status: Home / Self Care | Payer: PRIVATE HEALTH INSURANCE | Attending: Emergency Medicine | Admitting: Emergency Medicine

## 2019-08-17 ENCOUNTER — Encounter: Payer: Self-pay | Admitting: Emergency Medicine

## 2019-08-17 ENCOUNTER — Other Ambulatory Visit: Payer: Self-pay

## 2019-08-17 ENCOUNTER — Emergency Department: Payer: PRIVATE HEALTH INSURANCE

## 2019-08-17 DIAGNOSIS — N939 Abnormal uterine and vaginal bleeding, unspecified: Secondary | ICD-10-CM | POA: Insufficient documentation

## 2019-08-17 DIAGNOSIS — F17210 Nicotine dependence, cigarettes, uncomplicated: Secondary | ICD-10-CM | POA: Insufficient documentation

## 2019-08-17 LAB — CBC
HCT: 34.4 % — ABNORMAL LOW (ref 36.0–46.0)
Hemoglobin: 11.2 g/dL — ABNORMAL LOW (ref 12.0–15.0)
MCH: 27.1 pg (ref 26.0–34.0)
MCHC: 32.6 g/dL (ref 30.0–36.0)
MCV: 83.1 fL (ref 80.0–100.0)
Platelets: 259 10*3/uL (ref 150–400)
RBC: 4.14 MIL/uL (ref 3.87–5.11)
RDW: 18.1 % — ABNORMAL HIGH (ref 11.5–15.5)
WBC: 5.9 10*3/uL (ref 4.0–10.5)
nRBC: 0 % (ref 0.0–0.2)

## 2019-08-17 LAB — COMPREHENSIVE METABOLIC PANEL
ALT: 33 U/L (ref 0–44)
AST: 40 U/L (ref 15–41)
Albumin: 4.2 g/dL (ref 3.5–5.0)
Alkaline Phosphatase: 68 U/L (ref 38–126)
Anion gap: 8 (ref 5–15)
BUN: 9 mg/dL (ref 6–20)
CO2: 27 mmol/L (ref 22–32)
Calcium: 8.7 mg/dL — ABNORMAL LOW (ref 8.9–10.3)
Chloride: 103 mmol/L (ref 98–111)
Creatinine, Ser: 0.6 mg/dL (ref 0.44–1.00)
GFR calc Af Amer: >60 mL/min (ref >60)
GFR calc non Af Amer: >60 mL/min (ref >60)
Glucose, Bld: 94 mg/dL (ref 70–99)
Potassium: 4.2 mmol/L (ref 3.5–5.1)
Sodium: 138 mmol/L (ref 135–145)
Total Bilirubin: 0.7 mg/dL (ref 0.3–1.2)
Total Protein: 7.5 g/dL (ref 6.5–8.1)

## 2019-08-17 LAB — URINALYSIS, COMPLETE (UACMP) WITH MICROSCOPIC
Bilirubin Urine: NEGATIVE
Glucose, UA: NEGATIVE mg/dL
Ketones, ur: 5 mg/dL — AB
Nitrite: NEGATIVE
Protein, ur: 100 mg/dL — AB
RBC / HPF: >50 RBC/hpf — ABNORMAL HIGH (ref 0–5)
Specific Gravity, Urine: 1.023 (ref 1.005–1.030)
pH: 6 (ref 5.0–8.0)

## 2019-08-17 LAB — TYPE AND SCREEN
ABO/RH(D): O POS
Antibody Screen: NEGATIVE

## 2019-08-17 NOTE — ED Triage Notes (Signed)
Pt via pov from home with unusually heavy menstrual period with lots of blood clots since Saturday. Pt states she is changing tampons every hour or less. Pt states this is unusual and reports some cramping this morning and that she feels very tired and drained. Pt alert & oriented, nad noted.

## 2019-08-17 NOTE — ED Provider Notes (Signed)
[facility] Emergency Department Provider Note  Time seen: 3:44 PM  I have reviewed the triage vital signs and the nursing notes.   HISTORY  Chief Complaint Vaginal Bleeding   HPI Natalie Gregory is a 44 y.o. female with no significant past medical history presents emergency department for vaginal bleeding.  According to the patient she does not believe she had a period last month, states this month heavy bleeding starting 4 days ago.  States occasional clots as well.  Denies any abdominal pain.  No vomiting diarrhea or dysuria.  Patient states she was feeling fatigued today and given 4 days of heavy bleeding she was concerned so she came to the emergency department for evaluation.   Past Medical History:  Diagnosis Date  . Atypical squamous cell changes of undetermined significance (ASCUS) on cervical cytology with negative high risk human papilloma virus (HPV) test result   . 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 Trichomonal vulvovaginitis     Patient Active Problem List   Diagnosis Date Noted  . 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 Anxiety and depression 06/06/2017    Past Surgical History:  Procedure Laterality Date  . CESAREAN SECTION      Prior to Admission medications   Medication Sig Start Date End Date Taking? Authorizing Provider  methimazole (TAPAZOLE) 5 MG tablet SMARTSIG:3 Tablet(s) By Mouth Every Other Day PRN 01/18/19   [provider]  metoprolol succinate (TOPROL-XL) 50 MG 24 hr tablet Take 50 mg by mouth daily. 12/21/18   [provider]    No Known Allergies  Family History  Problem Relation Age of Onset  . Hypertension Mother   . Diabetes Brother   . Diabetes Maternal Aunt   . Hypertension Maternal Aunt   . Diabetes Maternal Uncle   . Hypertension Maternal Uncle     Social History Social History   Tobacco Use  . Smoking status: Current Every Day Smoker     Packs/day: 0.50    Types: Cigarettes  . Smokeless tobacco: Never Used  Vaping Use  . Vaping Use: Never used  Substance Use Topics  . Alcohol use: Yes    Comment: 2/day  . Drug use: No    Review of Systems Constitutional: Negative for fever. Cardiovascular: Negative for chest pain. Respiratory: Negative for shortness of breath. Gastrointestinal: Negative for abdominal pain, vomiting and diarrhea. Genitourinary: Negative for urinary compaints.  Positive for vaginal bleeding x4 days. Musculoskeletal: Negative for musculoskeletal complaints Neurological: Negative for headache All other ROS negative  ____________________________________________   PHYSICAL EXAM:  VITAL SIGNS: ED Triage Vitals  Enc Vitals Group     BP 08/17/19 1355 (!) 141/103     Pulse Rate 08/17/19 1355 71     Resp 08/17/19 1355 20     Temp 08/17/19 1355 98.2 F (36.8 C)     Temp Source 08/17/19 1355 Oral     SpO2 08/17/19 1355 100 %     Weight 08/17/19 1357 170 lb (77.1 kg)     Height 08/17/19 1357 5\' 4"  (1.626 m)     Head Circumference --      Peak Flow --      Pain Score 08/17/19 1356 5     Pain Loc --      Pain Edu? --      Excl. in [place]? --     Constitutional: Alert and oriented. Well appearing and in no distress. Eyes: Normal exam  ENT      Head: Normocephalic and atraumatic.      Mouth/Throat: Mucous membranes are moist. Cardiovascular: Normal rate, regular rhythm. Respiratory: Normal respiratory effort without tachypnea nor retractions. Breath sounds are clear Gastrointestinal: Soft and nontender. No distention.  Musculoskeletal: Nontender with normal range of motion in all extremities.  Neurologic:  Normal speech and language. No gross focal neurologic deficits  Skin:  Skin is warm, dry and intact.  Psychiatric: Mood and affect are normal.  ____________________________________________   RADIOLOGY  Ultrasound shows multiple uterine  fibroids.  ____________________________________________   INITIAL IMPRESSION / ASSESSMENT AND PLAN / ED COURSE  Pertinent labs & imaging results that were available during my care of the patient were reviewed by me and considered in my medical decision making (see chart for details).   Patient presents emergency department for vaginal bleeding over the past 4 days occasional clots.  No abdominal pain, largely benign abdominal exam.  Lab work is reassuring including a baseline hemoglobin of 11.2.  We will obtain a urine sample for pregnancy testing we will obtain an ultrasound.  Patient sees [facility] but has not yet contacted them.  Ultrasound shows multiple uterine fibroids otherwise largely nonrevealing.  Lab work is reassuring.  We will discharge the patient with [facility] follow-up.  Natalie Gregory was evaluated in Emergency Department on 08/17/2019 for the symptoms described in the history of present illness. She was evaluated in the context of the global COVID-19 pandemic, which necessitated consideration that the patient might be at risk for infection with the SARS-CoV-2 virus that causes COVID-19. Institutional protocols and algorithms that pertain to the evaluation of patients at risk for COVID-19 are in a state of rapid change based on information released by regulatory bodies including the CDC and federal and state organizations. These policies and algorithms were followed during the patient's care in the ED.  ____________________________________________   FINAL CLINICAL IMPRESSION(S) / ED DIAGNOSES  Vaginal bleeding   Harvest Dark, MD 08/17/19 [id number]

## 2019-08-17 NOTE — ED Notes (Signed)
POC PREG NEGATIVE

## 2019-08-18 ENCOUNTER — Ambulatory Visit (INDEPENDENT_AMBULATORY_CARE_PROVIDER_SITE_OTHER): Payer: PRIVATE HEALTH INSURANCE | Admitting: Obstetrics and Gynecology

## 2019-08-18 ENCOUNTER — Encounter: Payer: Self-pay | Admitting: *Deleted

## 2019-08-18 ENCOUNTER — Encounter: Payer: Self-pay | Admitting: Obstetrics and Gynecology

## 2019-08-18 ENCOUNTER — Other Ambulatory Visit: Payer: Self-pay

## 2019-08-18 VITALS — BP 161/95 | HR 76 | Ht 64.0 in | Wt 165.0 lb

## 2019-08-18 DIAGNOSIS — N921 Excessive and frequent menstruation with irregular cycle: Secondary | ICD-10-CM | POA: Diagnosis not present

## 2019-08-18 DIAGNOSIS — D252 Subserosal leiomyoma of uterus: Secondary | ICD-10-CM

## 2019-08-18 DIAGNOSIS — D251 Intramural leiomyoma of uterus: Secondary | ICD-10-CM

## 2019-08-18 NOTE — Progress Notes (Signed)
Obstetrics & Gynecology Office Visit   Chief Complaint  Patient presents with  . ER follow up    uterine fibroids   History of Present Illness: 44 y.o. G36P3003 female who presents in follow up from an ER visit yesterday.  She presented due to heavy bleeding.  She was changing a pad every hour (extra long overnight super).  She started having her bleeding 4 days ago.  She is passing clots (a lot).  She is still having to change her pad frequently.  She has cramps in the morning.   She has a history of hyperthyroidism. She takes metoprolol and mithimazole for this.  Her thyroid was last checked 11/2018 and her TSH/FT4 were normal.  She was anemic at that time, as well (hgb 9.2, hct 30.4).  She takes iron supplementation for this.   Prior to this episode her periods were regular, coming monthly, lasting 4-5 days.  They were not heavy, no clots.  No dysmenorrhea.  She has had a BTL.  She did skip June for her periods.    Last pap smeat 05/2017 - NILM, HPV negative.  She denies weight loss, new constipation. She does have bloating.  She denies early satiety.  She denies any other symptoms.    She saw her PCP about her anemia Princella Ion) and was given iron for supplementation.    Past Medical History:  Diagnosis Date  . Anxiety   . Atypical squamous cell changes of undetermined significance (ASCUS) on cervical cytology with negative high risk human papilloma virus (HPV) test result   . 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 Hyperthyroidism   . Trichomonal vulvovaginitis     Past Surgical History:  Procedure Laterality Date  . CESAREAN SECTION      Gynecologic History: Patient's last menstrual period was 08/14/2019 (exact date).  Obstetric History: [id number], s/p c-section x 3 (24, 48 and 44 years old)  Family History  Problem Relation Age of Onset  . Hypertension Mother   . Diabetes Brother   . Diabetes Maternal Aunt   .  Hypertension Maternal Aunt   . Diabetes Maternal Uncle   . Hypertension Maternal Uncle     Social History   Socioeconomic History  . Marital status: Single    Spouse name: Not on file  . Number of children: Not on file  . Years of education: Not on file  . Highest education level: Not on file  Occupational History  . Not on file  Tobacco Use  . Smoking status: Current Every Day Smoker    Packs/day: 0.50    Types: Cigarettes  . Smokeless tobacco: Never Used  Vaping Use  . Vaping Use: Never used  Substance and Sexual Activity  . Alcohol use: Yes    Comment: 2/day  . Drug use: No  . Sexual activity: Yes    Birth control/protection: None  Other Topics Concern  . Not on file  Social History Narrative  . Not on file   Social Determinants of Health   Financial Resource Strain:   . Difficulty of Paying Living Expenses:   Food Insecurity:   . Worried About Charity fundraiser in the Last Year:   . Arboriculturist in the Last Year:   Transportation Needs:   . Film/video editor (Medical):   Marland Kitchen Lack of Transportation (Non-Medical):   Physical Activity:   . Days of Exercise per Week:   . Minutes of  Exercise per Session:   Stress:   . Feeling of Stress :   Social Connections:   . Frequency of Communication with Friends and Family:   . Frequency of Social Gatherings with Friends and Family:   . Attends Religious Services:   . Active Member of Clubs or Organizations:   . Attends Archivist Meetings:   Marland Kitchen Marital Status:   Intimate Partner Violence:   . Fear of Current or Ex-Partner:   . Emotionally Abused:   Marland Kitchen Physically Abused:   . Sexually Abused:     No Known Allergies  Prior to Admission medications   Medication Sig Start Date End Date Taking? Authorizing Provider  escitalopram (LEXAPRO) 5 MG tablet Take 5 mg by mouth daily. 07/15/19  Yes [provider]  ferrous gluconate (FERGON) 324 MG tablet Take 324 mg by mouth 2 (two) times daily.  05/05/19  Yes [provider]  methimazole (TAPAZOLE) 5 MG tablet SMARTSIG:3 Tablet(s) By Mouth Every Other Day PRN 01/18/19  Yes [provider]  metoprolol succinate (TOPROL-XL) 50 MG 24 hr tablet Take 50 mg by mouth daily. 12/21/18  Yes [provider]    Review of Systems  Constitutional: Negative.   HENT: Negative.   Eyes: Negative.   Respiratory: Negative.   Cardiovascular: Negative.   Gastrointestinal: Negative.   Genitourinary: Negative.   Musculoskeletal: Negative.   Skin: Negative.   Neurological: Negative.   Psychiatric/Behavioral: Negative.      Physical Exam BP (!) 161/95   Pulse 76   Ht 5\' 4"  (1.626 m)   Wt 165 lb (74.8 kg)   LMP 08/14/2019 (Exact Date)   BMI 28.32 kg/m  Patient's last menstrual period was 08/14/2019 (exact date). Physical Exam Constitutional:      General: She is not in acute distress.    Appearance: Normal appearance. She is well-developed.  Genitourinary:     Pelvic exam was performed with patient in the lithotomy position.     Vulva, inguinal canal, urethra, bladder, vagina, uterus, right adnexa and left adnexa normal.     No posterior fourchette tenderness, injury or lesion present.     No cervical friability, lesion, bleeding or polyp.  HENT:     Head: Normocephalic and atraumatic.  Eyes:     General: No scleral icterus.    Conjunctiva/sclera: Conjunctivae normal.  Cardiovascular:     Rate and Rhythm: Normal rate and regular rhythm.     Heart sounds: No murmur heard.  No friction rub. No gallop.   Pulmonary:     Effort: Pulmonary effort is normal. No respiratory distress.     Breath sounds: Normal breath sounds. No wheezing or rales.  Abdominal:     General: Bowel sounds are normal. There is no distension.     Palpations: Abdomen is soft. There is no mass.     Tenderness: There is no abdominal tenderness. There is no guarding or rebound.  Musculoskeletal:        General: Normal range of motion.      Cervical back: Normal range of motion and neck supple.  Neurological:     General: No focal deficit present.     Mental Status: She is alert and oriented to person, place, and time.     Cranial Nerves: No cranial nerve deficit.  Skin:    General: Skin is warm and dry.     Findings: No erythema.  Psychiatric:        Mood and Affect: Mood normal.  Behavior: Behavior normal.        Judgment: Judgment normal.   Endometrial Biopsy After discussion with the patient regarding her abnormal uterine bleeding I recommended that she proceed with an endometrial biopsy for further diagnosis. The risks, benefits, alternatives, and indications for an endometrial biopsy were discussed with the patient in detail. She understood the risks including infection, bleeding, cervical laceration and uterine perforation.  Verbal consent was obtained.   PROCEDURE NOTE:  [place] endometrial biopsy was performed using aseptic technique with iodine preparation.  The cervix was unable to be cannulated.  An attempt to use a cervical dilator to find the cervical os did not lead to an obvious cervical os.  Repeated attempts were made.  The procedure was abandoned. The patient tolerated the procedure well.   Female chaperone present for pelvic and breast  portions of the physical exam  No results found.  Assessment: 44 y.o. G42P3003 female here for  1. Menorrhagia with irregular cycle   2. Intramural and subserous leiomyoma of uterus      Plan: Problem List Items Addressed This Visit    None    Visit Diagnoses    Menorrhagia with irregular cycle    -  Primary   Relevant Medications   medroxyPROGESTERone (PROVERA) 10 MG tablet   Intramural and subserous leiomyoma of uterus         Discussed heavy vaginal bleeding in this context.  She has had regular menses up to this point.  With her last menses she had very heavy bleeding. Discussed controlling bleeding with medication, not treating with medication, and  discussed surgical options, including hysterectomy. There exists the possibility that this is a one-off event.  She was given a prescription to slow her bleeding. She would like to see how it goes and decide from there how she would like to proceed. I was unsuccessful with an endometrial biopsy attempt today.  Discussed the implications of this.  Overall, I think her risk for malignancy was low, however I can't rule this out completely.  A total of 30 minutes were spent face-to-face with the patient as well as preparation, review, communication, and documentation during this encounter.    Prentice Docker, MD 08/18/2019 4:52 PM

## 2019-08-19 ENCOUNTER — Telehealth: Payer: Self-pay | Admitting: Obstetrics and Gynecology

## 2019-08-19 ENCOUNTER — Encounter: Payer: Self-pay | Admitting: Obstetrics and Gynecology

## 2019-08-19 MED ORDER — MEDROXYPROGESTERONE ACETATE 10 MG PO TABS: ORAL_TABLET | ORAL | 0 refills | Status: DC

## 2019-08-19 NOTE — Telephone Encounter (Signed)
Patient is calling to find out if her prescription was sent to the pharmacy for yesterday's appointment. Please advise

## 2019-08-19 NOTE — Telephone Encounter (Signed)
I just sent it in to [place]

## 2019-08-23 NOTE — Telephone Encounter (Signed)
LM with pt.

## 2019-08-26 ENCOUNTER — Telehealth: Payer: Self-pay

## 2019-08-26 ENCOUNTER — Other Ambulatory Visit: Payer: Self-pay | Admitting: Obstetrics and Gynecology

## 2019-08-26 DIAGNOSIS — N921 Excessive and frequent menstruation with irregular cycle: Secondary | ICD-10-CM

## 2019-08-26 MED ORDER — MEDROXYPROGESTERONE ACETATE 10 MG PO TABS: 10.0000 mg | ORAL_TABLET | Freq: Every day | ORAL | 0 refills | Status: DC

## 2019-08-26 NOTE — Telephone Encounter (Signed)
Spoke with pt. States she missed work today d/t starting to bleed again this morning. I told her I would have to ask SDJ about including todays date on her FMLA paperwork. Originally I only put July 7-9th. She is aware I will have talk with SDJ about this when he is back in office tomorrow. Will get paperwork done. But still waiting on her to fill out forms and pay processing fee.

## 2019-08-26 NOTE — Telephone Encounter (Signed)
Pt inquiring about FMLA papers.  [phone]

## 2019-08-26 NOTE — Telephone Encounter (Signed)
Pt called about bleeding; wants call back.  [phone]  Pt wants to know why she started bleeding again today. Is taking the pills.  Adv probably d/t the fibroid; to keep taking the pills.  Pt states it is a normal flow - is NOT saturating a pad q50min-1hr.  Adv SDJ is in [place] office today; will forward msg to him.  Pt wanted an appt - adv he didn't have any openings but will send this msg to him.

## 2019-08-26 NOTE — Telephone Encounter (Signed)
She did stop bleeding once she started taking 20 mg tid of provera.  Today is her last day of that dose.  She is supposed to drop to 10 mg daily tomorrow.  I recommended that she drop to 20 mg bid x 7 days, then 10 mg bid x 7 days, then 10 mg daily x 7 days. Discussed that she will have some bleeding probably.  But, we need to taper her off the medication.  If she is soaking a pad more than once per hour, she needs to let me know.   Her current bleeding "isn't bad." I think she was just surprised.  Will send in another 7 pills to cover her for the additional dosing.

## 2019-08-31 ENCOUNTER — Telehealth: Payer: Self-pay

## 2019-08-31 NOTE — Telephone Encounter (Signed)
Female from [employer] calling to verify medical info to expedite STD Claim: Dx, ICD.10 codes; clinical findings, 1st d out of work; estimated return to work date; office dates and tx.  [phone]. Left detailed msg of information needed.

## 2019-09-11 ENCOUNTER — Other Ambulatory Visit: Payer: Self-pay | Admitting: Obstetrics and Gynecology

## 2019-09-11 DIAGNOSIS — N921 Excessive and frequent menstruation with irregular cycle: Secondary | ICD-10-CM

## 2019-09-13 NOTE — Telephone Encounter (Signed)
PT has no future appointments. Please advise

## 2019-10-03 ENCOUNTER — Other Ambulatory Visit: Payer: Self-pay | Admitting: Obstetrics and Gynecology

## 2019-10-03 DIAGNOSIS — N921 Excessive and frequent menstruation with irregular cycle: Secondary | ICD-10-CM

## 2019-10-04 NOTE — Telephone Encounter (Signed)
Please advise if refill is appropriate 

## 2020-01-17 ENCOUNTER — Ambulatory Visit (INDEPENDENT_AMBULATORY_CARE_PROVIDER_SITE_OTHER): Payer: PRIVATE HEALTH INSURANCE | Admitting: Obstetrics and Gynecology

## 2020-01-17 ENCOUNTER — Other Ambulatory Visit: Payer: Self-pay

## 2020-01-17 ENCOUNTER — Encounter: Payer: Self-pay | Admitting: Obstetrics and Gynecology

## 2020-01-17 VITALS — BP 122/90 | Ht 64.0 in | Wt 168.0 lb

## 2020-01-17 DIAGNOSIS — R102 Pelvic and perineal pain: Secondary | ICD-10-CM

## 2020-01-17 NOTE — Patient Instructions (Signed)
I value your feedback and entrusting us with your care. If you get a [facility] patient survey, I would appreciate you taking the time to let us know about your experience today. Thank you!  As of January 21, 2019, your lab results will be released to your MyChart immediately, before I even have a chance to see them. Please give me time to review them and contact you if there are any abnormalities. Thank you for your patience.  

## 2020-01-17 NOTE — Progress Notes (Signed)
[facility] [place]   Chief Complaint  Patient presents with  . Pelvic Pain    center pelvic pain    HPI:      [name] is a 44 y.o. [id number] whose LMP was Patient's last menstrual period was 01/10/2020 (exact date)., presents today for pelvic pains the past 5 days. Pains are fleeting cramps, waking her up in the AM. Last about 15-20 sec, then resolve. Happen a couple times in the mornings and then resolve. No meds needed, no urin, vag, GI sx. No LBP, fevers. Pt's LMP started 01/10/20. Still bleeding but flow is lighter. Changing products Q3-4 hrs on heavier days last wk.  She is sex active infrequently, using condoms. No pain/bleeding. No new partners.   Hx of menorrhagia 7/21, improved with provera tx. Sx resolved. Hx of several leio per 7/21 u/s, largest 3.2 cm x 2.6 cm x 2.5 cm  Past Medical History:  Diagnosis Date  . Anxiety   . Atypical squamous cell changes of undetermined significance (ASCUS) on cervical cytology with negative high risk human papilloma virus (HPV) test result   . 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 Hyperthyroidism   . Trichomonal vulvovaginitis     Past Surgical History:  Procedure Laterality Date  . CESAREAN SECTION      Family History  Problem Relation Age of Onset  . Hypertension Mother   . Diabetes Brother   . Diabetes Maternal Aunt   . Hypertension Maternal Aunt   . Diabetes Maternal Uncle   . Hypertension Maternal Uncle     Social History   Socioeconomic History  . Marital status: Single    Spouse name: Not on file  . Number of children: Not on file  . Years of education: Not on file  . Highest education level: Not on file  Occupational History  . Not on file  Tobacco Use  . Smoking status: Current Every Day Smoker    Packs/day: 0.50    Types: Cigarettes  . Smokeless tobacco: Never Used  Vaping Use  . Vaping Use: Never used  Substance and Sexual  Activity  . Alcohol use: Yes    Comment: 2/day  . Drug use: No  . Sexual activity: Not Currently    Birth control/protection: None  Other Topics Concern  . Not on file  Social History Narrative  . Not on file   Social Determinants of Health   Financial Resource Strain:   . Difficulty of Paying Living Expenses: Not on file  Food Insecurity:   . Worried About Charity fundraiser in the Last Year: Not on file  . Ran Out of Food in the Last Year: Not on file  Transportation Needs:   . Lack of Transportation (Medical): Not on file  . Lack of Transportation (Non-Medical): Not on file  Physical Activity:   . Days of Exercise per Week: Not on file  . Minutes of Exercise per Session: Not on file  Stress:   . Feeling of Stress : Not on file  Social Connections:   . Frequency of Communication with Friends and Family: Not on file  . Frequency of Social Gatherings with Friends and Family: Not on file  . Attends Religious Services: Not on file  . Active Member of Clubs or Organizations: Not on file  . Attends Archivist Meetings: Not on file  . Marital Status: Not on file  Intimate Partner  Violence:   . Fear of Current or Ex-Partner: Not on file  . Emotionally Abused: Not on file  . Physically Abused: Not on file  . Sexually Abused: Not on file    Outpatient Medications Prior to Visit  Medication Sig Dispense Refill  . escitalopram (LEXAPRO) 5 MG tablet Take 5 mg by mouth daily.    . ferrous gluconate (FERGON) 324 MG tablet Take 324 mg by mouth 2 (two) times daily.    . medroxyPROGESTERone (PROVERA) 10 MG tablet TAKE 2 TABLETS 3 TIMES A DAY FOR 7 DAYS , THEN 1 TAB DAILY FOR 10 DAYS FOR HEAVY BLEEDING 84 tablet 0  . methimazole (TAPAZOLE) 5 MG tablet SMARTSIG:3 Tablet(s) By Mouth Every Other Day PRN    . metoprolol succinate (TOPROL-XL) 50 MG 24 hr tablet Take 50 mg by mouth daily.    . medroxyPROGESTERone (PROVERA) 10 MG tablet Take 1 tablet (10 mg total) by mouth daily for  10 days. 10 tablet 0   No facility-administered medications prior to visit.      ROS:  Review of Systems  Constitutional: Negative for fever.  Gastrointestinal: Negative for blood in stool, constipation, diarrhea, nausea and vomiting.  Genitourinary: Positive for pelvic pain. Negative for dyspareunia, dysuria, flank pain, frequency, hematuria, urgency, vaginal bleeding, vaginal discharge and vaginal pain.  Musculoskeletal: Negative for back pain.  Skin: Negative for rash.    OBJECTIVE:   Vitals:  BP 122/90   Ht 5\' 4"  (1.626 m)   Wt 168 lb (76.2 kg)   LMP 01/10/2020 (Exact Date)   BMI 28.84 kg/m   Physical Exam Vitals reviewed.  Constitutional:      Appearance: She is well-developed.  Pulmonary:     Effort: Pulmonary effort is normal.  Genitourinary:    General: Normal vulva.     Pubic Area: No rash.      Labia:        Right: No rash, tenderness or lesion.        Left: No rash, tenderness or lesion.      Vagina: Bleeding present. No vaginal discharge, erythema or tenderness.     Cervix: Normal.     Uterus: Normal. Not enlarged and not tender.      Adnexa: Right adnexa normal and left adnexa normal.       Right: No mass or tenderness.         Left: No mass or tenderness.    Musculoskeletal:        General: Normal range of motion.     Cervical back: Normal range of motion.  Skin:    General: Skin is warm and dry.  Neurological:     General: No focal deficit present.     Mental Status: She is alert and oriented to person, place, and time.  Psychiatric:        Mood and Affect: Mood normal.        Behavior: Behavior normal.        Thought Content: Thought content normal.        Judgment: Judgment normal.     Assessment/Plan: Pelvic pain--few sx in AM for past 5 days, sx during menses. Neg GYN exam. Most likely related to menses and leio. Reassurance. F/u if sx persist, but will most likely resolve.      Return if symptoms worsen or fail to  improve.  Da Authement B. Elhadji Pecore, PA-C 01/17/2020 4:57 PM

## 2020-02-03 ENCOUNTER — Other Ambulatory Visit: Payer: Self-pay | Admitting: Obstetrics and Gynecology

## 2020-02-03 DIAGNOSIS — N921 Excessive and frequent menstruation with irregular cycle: Secondary | ICD-10-CM

## 2020-02-26 IMAGING — US US ABDOMEN LIMITED
1 series · 14 of 25 positions shown · non-contrast
Comparison: None.

CLINICAL DATA: Right upper quadrant pain for 3 weeks.

EXAM:
ULTRASOUND ABDOMEN LIMITED RIGHT UPPER QUADRANT

[Series 1: us abdomen limited · 0.20mm/px · 14 of 41 slices shown]
[im 1/41]
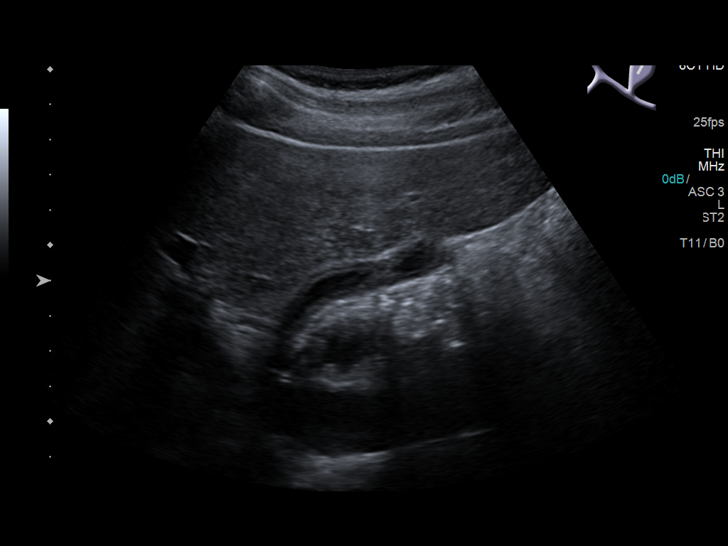
[im 4/41]
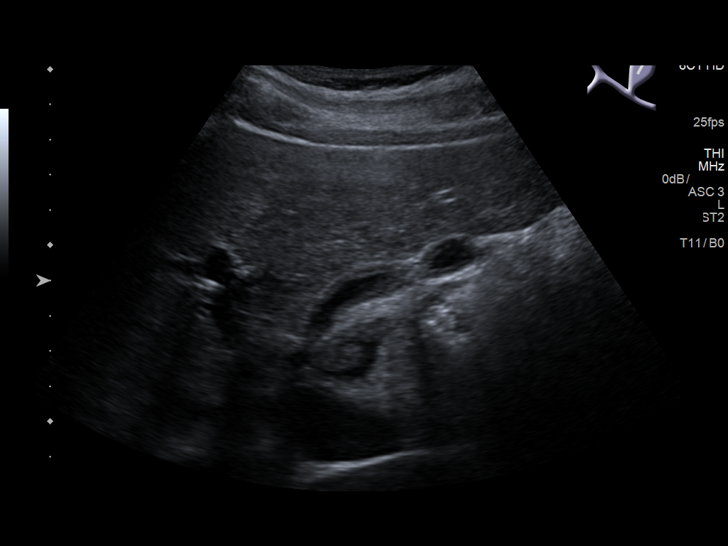
[im 7/41]
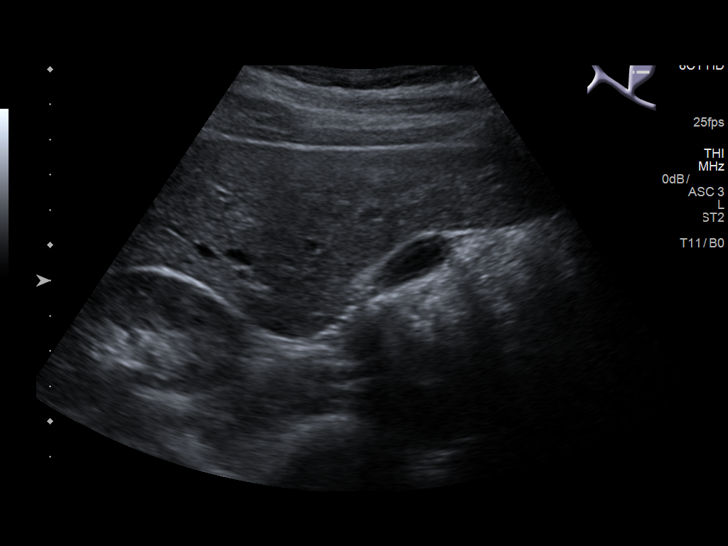
[im 11/41]
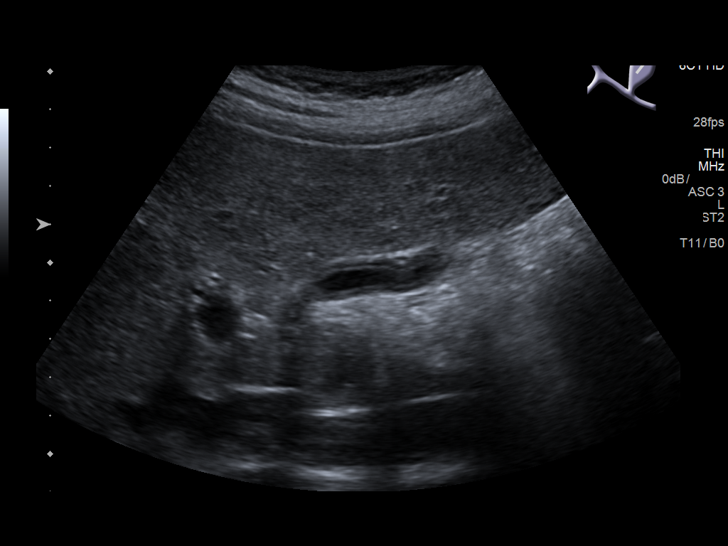
[im 14/41]
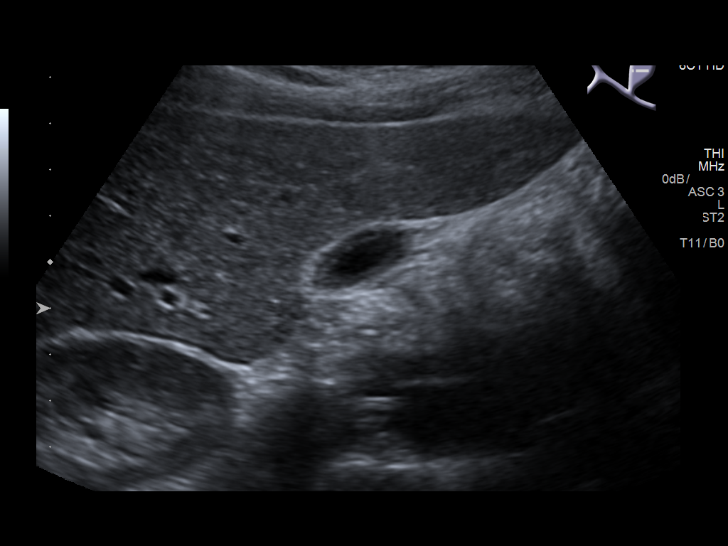
[im 16/41]
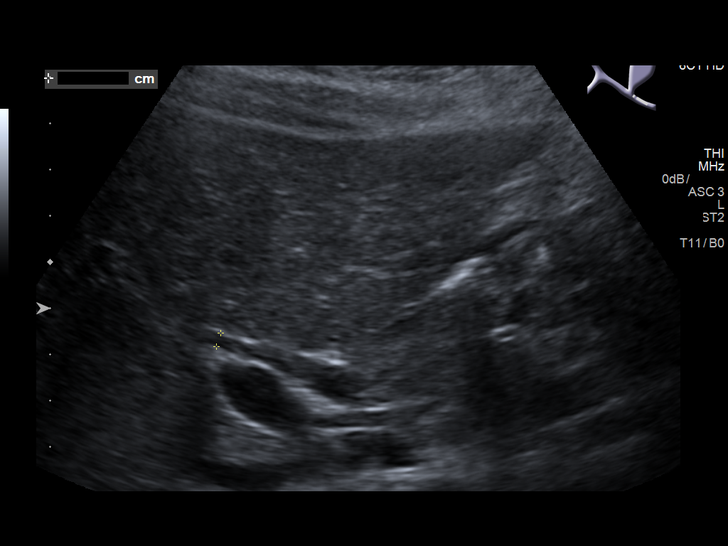
[im 19/41]
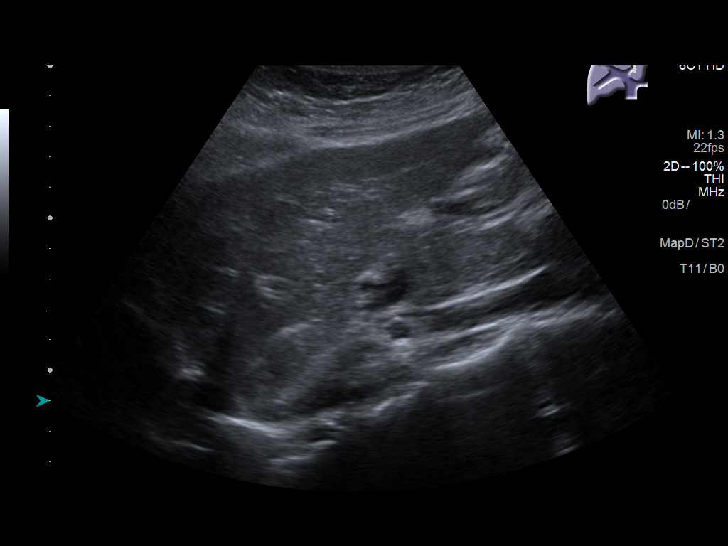
[im 22/41]
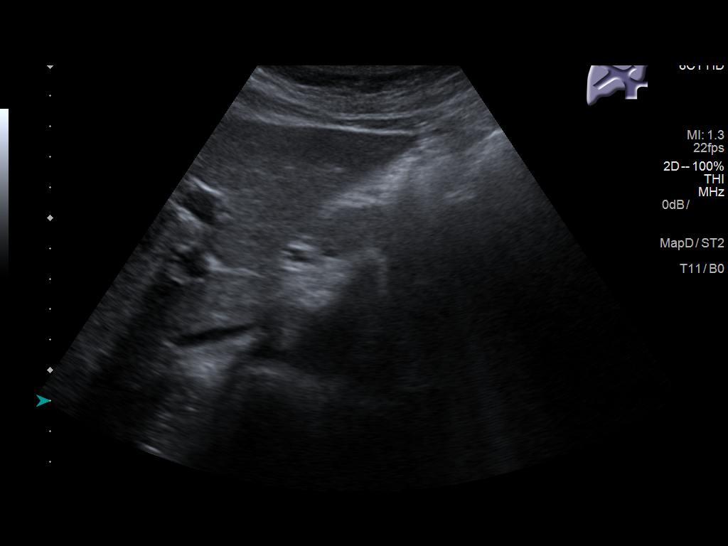
[im 26/41]
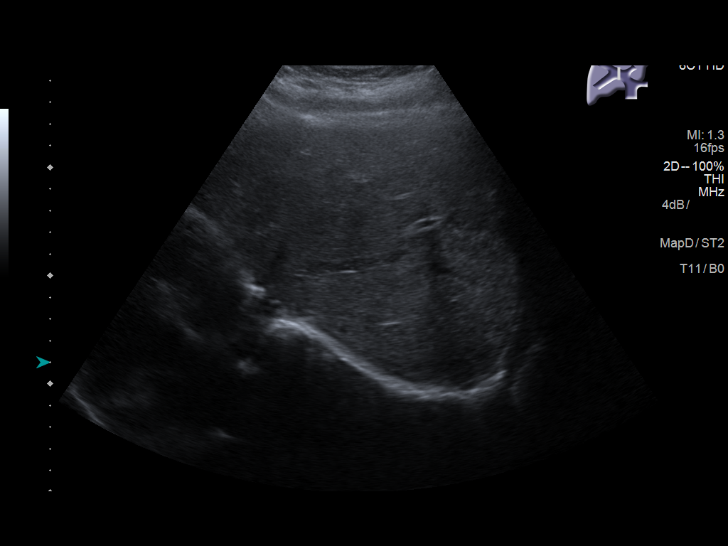
[im 27/41]
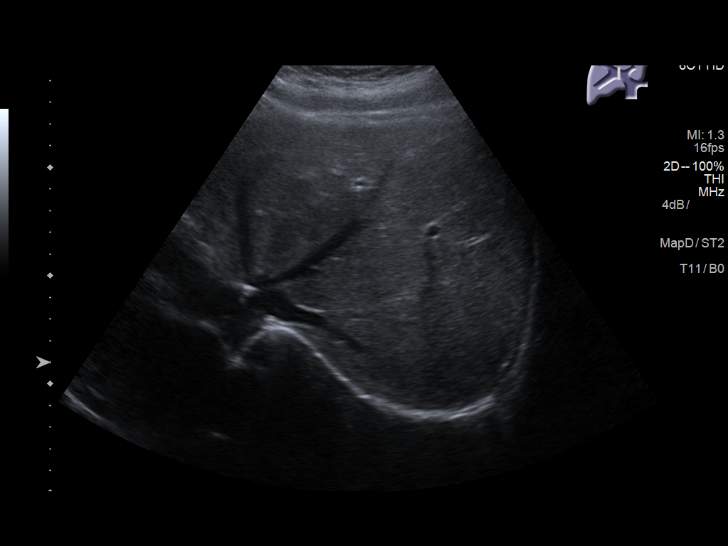
[im 31/41]
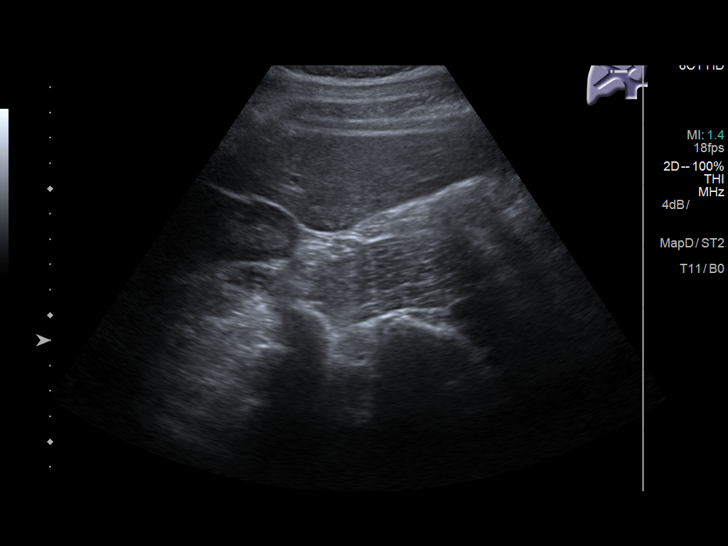
[im 34/41]
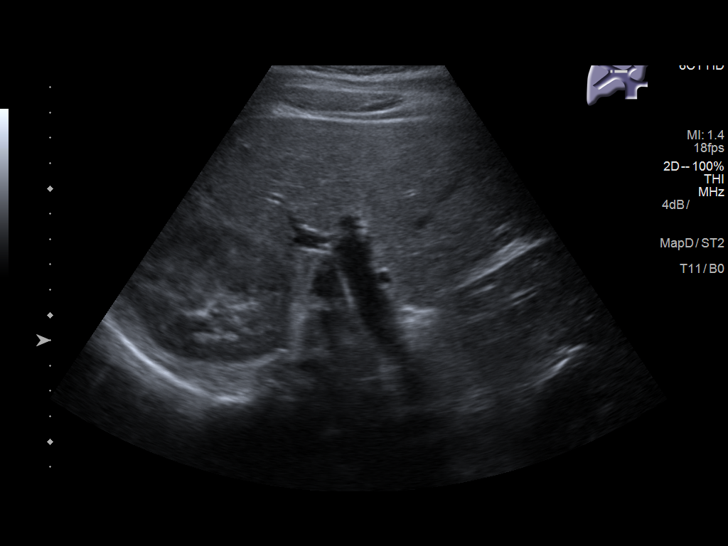
[im 37/41]
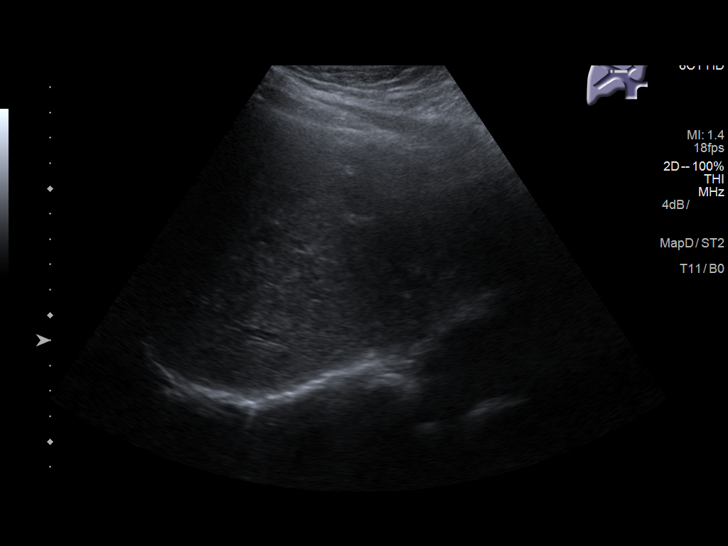
[im 41/41]
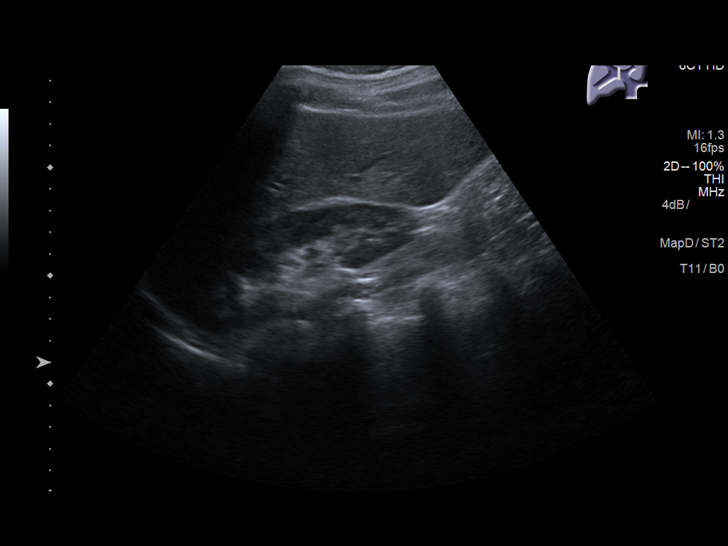

[14 of 25 positions shown; findings below may reference images not displayed]

FINDINGS: Gallbladder:

Contracted gallbladder. Wall thickness of 3 mm, within normal
limits. No gallstones identified. No sonographic Murphy sign noted
by sonographer.

Common bile duct:

Diameter: 3 mm

Liver:

No focal lesion identified. Within normal limits in parenchymal
echogenicity. Portal vein is patent on color Doppler imaging with
normal direction of blood flow towards the liver.
IMPRESSION: Contracted gallbladder. No gallstones or acute abnormality
identified.

## 2020-03-31 ENCOUNTER — Other Ambulatory Visit: Payer: Self-pay | Admitting: Obstetrics and Gynecology

## 2020-03-31 DIAGNOSIS — N921 Excessive and frequent menstruation with irregular cycle: Secondary | ICD-10-CM

## 2020-06-21 ENCOUNTER — Other Ambulatory Visit: Payer: Self-pay | Admitting: Obstetrics and Gynecology

## 2020-06-21 DIAGNOSIS — N921 Excessive and frequent menstruation with irregular cycle: Secondary | ICD-10-CM

## 2020-07-24 ENCOUNTER — Encounter: Payer: Self-pay | Admitting: Podiatry

## 2020-07-24 ENCOUNTER — Other Ambulatory Visit: Payer: Self-pay

## 2020-07-24 ENCOUNTER — Ambulatory Visit (INDEPENDENT_AMBULATORY_CARE_PROVIDER_SITE_OTHER): Payer: BC Managed Care – PPO | Admitting: Podiatry

## 2020-07-24 DIAGNOSIS — M2041 Other hammer toe(s) (acquired), right foot: Secondary | ICD-10-CM | POA: Diagnosis not present

## 2020-07-24 DIAGNOSIS — R52 Pain, unspecified: Secondary | ICD-10-CM | POA: Diagnosis not present

## 2020-07-24 DIAGNOSIS — L84 Corns and callosities: Secondary | ICD-10-CM

## 2020-07-24 DIAGNOSIS — B353 Tinea pedis: Secondary | ICD-10-CM | POA: Diagnosis not present

## 2020-07-24 MED ORDER — TERBINAFINE HCL 250 MG PO TABS: 250.0000 mg | ORAL_TABLET | Freq: Every day | ORAL | 0 refills | Status: DC

## 2020-07-24 MED ORDER — KETOCONAZOLE 2 % EX CREA
1.0000 | TOPICAL_CREAM | Freq: Every day | CUTANEOUS | 2 refills | Status: DC
Start: 2020-07-24 — End: 2022-11-19

## 2020-07-24 NOTE — Progress Notes (Signed)
  Subjective:  Patient ID: Natalie Gregory, female    DOB: [birth year],  MRN: [id number]  Chief Complaint  Patient presents with   Callouses     NP/ I HAVE CORNS IN BETWEEN MY PINKIE TOE AND THE TOE NEXT TO IT/ REQ DAY    45 y.o. female presents with the above complaint. History confirmed with patient.  She also has significant dry skin that causes a lot of itching and she is lotion but it does not go away.  Objective:  Physical Exam: warm, good capillary refill, no trophic changes or ulcerative lesions, normal DP and PT pulses, and normal sensory exam.  She has bilateral moccasin distribution interdigital tinea pedis with dry scaling rash.  Right foot she has flexible hammertoe deformities with adductovarus rotation of the fifth toe with interdigital corn on the lateral facing edge of the right PIPJ fourth toe Assessment:   1. Hammertoe of right foot   2. Callus of foot   3. Tinea pedis of both feet      Plan:  Patient was evaluated and treated and all questions answered.  Discussed etiology and treatment options for the corns as well as the dry scaling skin.  For the corns we discussed how the hammertoe deformities are contributing to this.  We discussed treatment with excision, debridement and padding with silicone pads.  I dispensed her silicone pads and debrided the lesion today.  We will discuss surgical correction with arthroplasty of the fourth and fifth toes and she will consider this and talk to her job about taking about a month off to do this.  She will let me know when she is ready for this.  Discussed the etiology and treatment options for tinea pedis.  Discussed topical and oral treatment.  Recommended topical treatment with 2% ketoconazole cream.  This was sent to the patient's pharmacy.  Also discussed appropriate foot hygiene, use of antifungal spray such as Tinactin in shoes, as well as cleaning her foot surfaces such as showers and bathroom floors with bleach.  I also  recommended oral therapy concomitantly with 30 days of Lamisil   Return if symptoms worsen or fail to improve.

## 2020-08-08 ENCOUNTER — Other Ambulatory Visit: Payer: Self-pay | Admitting: Obstetrics and Gynecology

## 2020-08-08 DIAGNOSIS — N921 Excessive and frequent menstruation with irregular cycle: Secondary | ICD-10-CM

## 2020-08-16 ENCOUNTER — Ambulatory Visit (INDEPENDENT_AMBULATORY_CARE_PROVIDER_SITE_OTHER): Payer: BC Managed Care – PPO | Admitting: Podiatry

## 2020-08-16 ENCOUNTER — Other Ambulatory Visit: Payer: Self-pay

## 2020-08-16 ENCOUNTER — Encounter: Payer: Self-pay | Admitting: Podiatry

## 2020-08-16 ENCOUNTER — Ambulatory Visit (INDEPENDENT_AMBULATORY_CARE_PROVIDER_SITE_OTHER): Payer: BC Managed Care – PPO

## 2020-08-16 ENCOUNTER — Telehealth: Payer: Self-pay

## 2020-08-16 ENCOUNTER — Other Ambulatory Visit: Payer: Self-pay | Admitting: Podiatry

## 2020-08-16 DIAGNOSIS — S92502A Displaced unspecified fracture of left lesser toe(s), initial encounter for closed fracture: Secondary | ICD-10-CM

## 2020-08-16 DIAGNOSIS — S99921A Unspecified injury of right foot, initial encounter: Secondary | ICD-10-CM

## 2020-08-16 NOTE — Patient Instructions (Addendum)
Look for urea 87% salicylic acid cream or ointment and apply to the thickened dry skin / calluses. This can be bought over the counter, at a pharmacy or online such as [employer].  Use that with a pedi egg and the ketoconazole cream (one in the morning and urea/salicyclic at night)

## 2020-08-16 NOTE — Telephone Encounter (Signed)
ERROR

## 2020-08-19 NOTE — Progress Notes (Signed)
  Subjective:  Patient ID: Natalie Gregory, female    DOB: 1975/09/03,  MRN: [id number]  Chief Complaint  Patient presents with   Toe Injury    "I hit my right toe on the couch 3 days ago and I broke it"  She says she had immediate pain and swelling,  was seen at [facility] and given a boot.  She has been taking Tylenol with some relief    45 y.o. female presents with the above complaint. History confirmed with patient.   Objective:  Physical Exam: warm, good capillary refill, no trophic changes or ulcerative lesions, normal DP and PT pulses, and normal sensory exam.  Right Foot: Right first toe pain on proximal phalanx   Radiographs: Multiple views x-ray of the right foot: Minimally displaced spiral fracture of fourth proximal phalanx Assessment:   1. Injury of right toe, initial encounter   2. Closed fracture of phalanx of left fourth toe, initial encounter      Plan:  Patient was evaluated and treated and all questions answered.  Discussed treatment of digital fracture including buddy taping and splinting which I demonstrated for her.  WBAT in the boot.  Can wear regular shoes if comfortable follow-up in 1 month.  OTC NSAIDs as needed for pain.  RICE protocol reviewed.  Return in about 1 month (around 09/16/2020) for f/u toe fracture and tinea pedis.

## 2020-08-23 ENCOUNTER — Telehealth: Payer: Self-pay | Admitting: *Deleted

## 2020-08-23 DIAGNOSIS — M79676 Pain in unspecified toe(s): Secondary | ICD-10-CM

## 2020-08-23 NOTE — Telephone Encounter (Signed)
"  I'm calling regarding the patient's FMLA.  She's filing for a medical leave.  You need to verify the following information in order for [place] to expedite the claims processing.  The following details needed is diagnosis, icd-10 codes, any test or clinical findings, first day off of work, the estimated return to work date, the office visit needed, treatment plan like surgery, therapy or medication.  Give [place] a call back.  You can leave the information on my voicemail."

## 2020-08-23 NOTE — Telephone Encounter (Signed)
"  [employer] said they have not received a fax from you all yet.  Could someone give me a call back as soon as possible so I can get it started?"  I am returning your call.  I just completed your FMLA paperwork and faxed it.  I do want to make you aware that we have a 10 to 14 business day policy to complete FMLA paperwork.  "Oh okay, thank you and thanks for calling me back."

## 2020-08-24 ENCOUNTER — Other Ambulatory Visit: Payer: Self-pay | Admitting: Podiatry

## 2020-09-06 ENCOUNTER — Telehealth: Payer: Self-pay

## 2020-09-06 NOTE — Telephone Encounter (Signed)
Pt presented to office to bring in FMLA paperwork to be completed. While she was here she stated that she was having a reaction to the lamisil ot the ketoconozal cream. She stated that in June when the cream was called in she didn't use it regularly. She stated that while using both her lips itch and her hands and feet itch as well. After speaking with Dr Sherryle Lis I advised the pt to stop the lamisil and continue the cream and if there is still discomfort then to discontinue both and the provider will reassess next week on 09/13/2020.

## 2020-09-13 ENCOUNTER — Other Ambulatory Visit: Payer: Self-pay

## 2020-09-13 ENCOUNTER — Ambulatory Visit (INDEPENDENT_AMBULATORY_CARE_PROVIDER_SITE_OTHER): Payer: BC Managed Care – PPO | Admitting: Podiatry

## 2020-09-13 ENCOUNTER — Encounter: Payer: Self-pay | Admitting: Podiatry

## 2020-09-13 ENCOUNTER — Ambulatory Visit (INDEPENDENT_AMBULATORY_CARE_PROVIDER_SITE_OTHER): Payer: BC Managed Care – PPO

## 2020-09-13 DIAGNOSIS — S92502S Displaced unspecified fracture of left lesser toe(s), sequela: Secondary | ICD-10-CM | POA: Diagnosis not present

## 2020-09-13 DIAGNOSIS — S92501D Displaced unspecified fracture of right lesser toe(s), subsequent encounter for fracture with routine healing: Secondary | ICD-10-CM

## 2020-09-13 DIAGNOSIS — L243 Irritant contact dermatitis due to cosmetics: Secondary | ICD-10-CM

## 2020-09-13 NOTE — Progress Notes (Signed)
  Subjective:  Patient ID: Natalie Gregory, female    DOB: Jun 03, 1975,  MRN: [id number]  Chief Complaint  Patient presents with   Fracture     Return in about 1 month for f/u toe fracture right    Tinea Pedis    Bilateral tinea pedis.     45 y.o. female presents with the above complaint. History confirmed with patient.  Toe is still painful.  She stopped using Lamisil and ketoconazole because she was developing a rash all over her body  Objective:  Physical Exam: warm, good capillary refill, no trophic changes or ulcerative lesions, normal DP and PT pulses, and normal sensory exam.  Right Foot: Right fourth toe pain on proximal phalanx   Radiographs: New multiple views x-ray of the right foot: Position maintained from previous radiographs she has new bone callus formation forming Assessment:   1. Closed fracture of phalanx of right fourth toe with routine healing, subsequent encounter   2. Irritant contact dermatitis due to cosmetics      Plan:  Patient was evaluated and treated and all questions answered.  Overall improving shoe gear is still painful.  She is not able to fit her composite shoes on.  I dispensed her silicone toe caps to take pressure off this.  She will try these to see if it is comfortable in her shoes.  Otherwise we will keep her out of work for another 2 to 4 weeks in 2-week increments and let me know how she is doing.  Return if further issues with this in the future advised I will continue be swollen for a while.  Regarding the rash I doubt this was a specific reaction to the Lamisil or ketoconazole.  I think she can resume the ketoconazole if she would like.  She recently started using a new soap and I think this is the likely culprit.  Her PCP sent her medication for this and advised her to use a mild skin sensitive soap   Return if symptoms worsen or fail to improve.

## 2020-09-14 ENCOUNTER — Telehealth: Payer: Self-pay | Admitting: *Deleted

## 2020-09-14 NOTE — Telephone Encounter (Signed)
Patient is calling because her toes are not comfortable,sore still. She is requesting an out of work note for another week. Will pick up tomorrow if approved.Please advise.

## 2020-09-15 ENCOUNTER — Encounter: Payer: Self-pay | Admitting: *Deleted

## 2020-09-15 DIAGNOSIS — M79676 Pain in unspecified toe(s): Secondary | ICD-10-CM

## 2020-09-15 NOTE — Telephone Encounter (Signed)
Out of work from 08/08-08/16 note printed and ready for patient's pick up at front desk. The patient has been notified.

## 2021-02-06 ENCOUNTER — Ambulatory Visit: Payer: Self-pay | Admitting: Obstetrics

## 2021-02-09 ENCOUNTER — Ambulatory Visit: Payer: Self-pay | Admitting: Licensed Practical Nurse

## 2021-02-13 ENCOUNTER — Ambulatory Visit: Payer: Self-pay | Admitting: Licensed Practical Nurse

## 2021-02-27 ENCOUNTER — Ambulatory Visit: Payer: Self-pay | Admitting: Obstetrics

## 2021-09-28 ENCOUNTER — Emergency Department
Admission: EM | Admit: 2021-09-28 | Discharge: 2021-09-28 | Disposition: A | Discharge status: Home / Self Care | Payer: BLUE CROSS/BLUE SHIELD | Attending: Emergency Medicine | Admitting: Emergency Medicine

## 2021-09-28 ENCOUNTER — Other Ambulatory Visit: Payer: Self-pay

## 2021-09-28 ENCOUNTER — Emergency Department: Payer: BLUE CROSS/BLUE SHIELD

## 2021-09-28 DIAGNOSIS — F17210 Nicotine dependence, cigarettes, uncomplicated: Secondary | ICD-10-CM | POA: Diagnosis not present

## 2021-09-28 DIAGNOSIS — R102 Pelvic and perineal pain: Secondary | ICD-10-CM | POA: Diagnosis not present

## 2021-09-28 DIAGNOSIS — Z79899 Other long term (current) drug therapy: Secondary | ICD-10-CM | POA: Insufficient documentation

## 2021-09-28 DIAGNOSIS — N921 Excessive and frequent menstruation with irregular cycle: Secondary | ICD-10-CM | POA: Diagnosis not present

## 2021-09-28 DIAGNOSIS — N939 Abnormal uterine and vaginal bleeding, unspecified: Secondary | ICD-10-CM | POA: Diagnosis present

## 2021-09-28 DIAGNOSIS — N946 Dysmenorrhea, unspecified: Secondary | ICD-10-CM | POA: Diagnosis not present

## 2021-09-28 LAB — CBC WITH DIFFERENTIAL/PLATELET
Abs Immature Granulocytes: 0.02 10*3/uL (ref 0.00–0.07)
Basophils Absolute: 0.1 10*3/uL (ref 0.0–0.1)
Basophils Relative: 2 %
Eosinophils Absolute: 0.2 10*3/uL (ref 0.0–0.5)
Eosinophils Relative: 3 %
HCT: 31 % — ABNORMAL LOW (ref 36.0–46.0)
Hemoglobin: 9.3 g/dL — ABNORMAL LOW (ref 12.0–15.0)
Immature Granulocytes: 0 %
Lymphocytes Relative: 30 %
Lymphs Abs: 1.9 10*3/uL (ref 0.7–4.0)
MCH: 23.1 pg — ABNORMAL LOW (ref 26.0–34.0)
MCHC: 30 g/dL (ref 30.0–36.0)
MCV: 77.1 fL — ABNORMAL LOW (ref 80.0–100.0)
Monocytes Absolute: 0.5 10*3/uL (ref 0.1–1.0)
Monocytes Relative: 8 %
Neutro Abs: 3.7 10*3/uL (ref 1.7–7.7)
Neutrophils Relative %: 57 %
Platelets: 325 10*3/uL (ref 150–400)
RBC: 4.02 MIL/uL (ref 3.87–5.11)
RDW: 17.7 % — ABNORMAL HIGH (ref 11.5–15.5)
WBC: 6.4 10*3/uL (ref 4.0–10.5)
nRBC: 0 % (ref 0.0–0.2)

## 2021-09-28 NOTE — ED Provider Notes (Signed)
[facility] Emergency Department Provider Note   ____________________________________________   Event Date/Time   First MD Initiated Contact with Patient 09/28/21 1802     (approximate)  I have reviewed the triage vital signs and the nursing notes.   HISTORY  Chief Complaint Dysmenorrhea    HPI Natalie Gregory is a 46 y.o. female presents to the emergency room for complaint of heavy vaginal bleeding for the past 4 days. Patient reports that for the past 2 weeks she has been having intermittent heavy vaginal bleeding. She reports that for the first 2 days she was having to change her tampon/pad every hour.  After the initial 2 days she is only having to change the pad/tampon about every 4 hours.  However, she reports that she was also passing significantly large blood clots until 2 days ago.. Patient reports that approximately 9 months ago her primary care doctor, [facility], had started her on Depo for heavy menses.  She reports that until 2 weeks ago this had helped with her symptoms and she was only having intermittent spotting. Patient reports that several years ago she was diagnosed with uterine fibroids and is concerned that this could be a problem now. Patient is also complaining of significant amount of lower abdominal cramping.  She reports the abdominal cramping feels like "labor pains" she reports that the cramping is coming every 5 to 10 minutes. Patient reports that her tubes are tied therefore she cannot be pregnant. Patient reports that she has not taken anything for the pain at this time.    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 Authorizing Provider  escitalopram (LEXAPRO) 5 MG tablet Take 5 mg by mouth daily. 07/15/19   [provider]  ferrous gluconate (FERGON) 324 MG tablet Take 324 mg by mouth 2 (two) times daily. 05/05/19   [provider]  ketoconazole (NIZORAL) 2 % cream Apply 1 application topically daily. 07/24/20   McDonald, Stephan Minister, DPM  medroxyPROGESTERone (PROVERA) 10 MG tablet TAKE 2 TABLETS 3 TIMES A DAY FOR 7 DAYS , THEN 1 TAB DAILY FOR 10 DAYS FOR HEAVY BLEEDING 03/31/20   Will Bonnet, MD  methimazole (TAPAZOLE) 5 MG tablet SMARTSIG:3 Tablet(s) By Mouth Every Other Day PRN 01/18/19   [provider]  metoprolol succinate (TOPROL-XL) 50 MG 24 hr tablet Take 50 mg by mouth daily. 12/21/18   [provider]  terbinafine (LAMISIL) 250 MG tablet TAKE 1 TABLET BY MOUTH EVERY DAY 08/24/20   Criselda Peaches, DPM    Allergies Patient has no known allergies.  Family History  Problem Relation Age of Onset   Hypertension Mother    Diabetes Brother    Diabetes Maternal Aunt    Hypertension Maternal Aunt    Diabetes Maternal Uncle  Hypertension Maternal Uncle     Social History Social History   Tobacco Use   Smoking status: Every Day    Packs/day: 0.50    Types: Cigarettes   Smokeless tobacco: Never  Vaping Use   Vaping Use: Never used  Substance Use Topics   Alcohol use: Yes    Comment: 2/day   Drug use: No    Review of Systems  Constitutional: No fever/chills Eyes: No visual changes. ENT: No sore throat. Cardiovascular: Denies chest pain. Respiratory: Denies shortness of breath. Gastrointestinal: No  nausea, no vomiting.  No diarrhea.  No constipation.  Positive for abdominal pain Genitourinary: Negative for dysuria.  Positive for heavy menses. Musculoskeletal: Negative for back pain. Skin: Negative for rash. Neurological: Negative for headaches, focal weakness or numbness.   ____________________________________________   PHYSICAL EXAM:  VITAL SIGNS: ED Triage Vitals  Enc Vitals Group     BP 09/28/21 1532 (!) 168/97     Pulse Rate 09/28/21 1532 87     Resp 09/28/21 1532 18     Temp 09/28/21 1532 98.4 F (36.9 C)     Temp Source 09/28/21 1532 Oral     SpO2 09/28/21 1532 100 %     Weight 09/28/21 1441 167 lb 15.9 oz (76.2 kg)     Height 09/28/21 1441 '5\' 4"'$  (1.626 m)     Head Circumference --      Peak Flow --      Pain Score 09/28/21 1440 8     Pain Loc --      Pain Edu? --      Excl. in [place]? --     Constitutional: Alert and oriented. Well appearing and in no acute distress. Eyes: Conjunctivae are normal. PERRL. EOMI. Head: Atraumatic. Nose: No congestion/rhinnorhea. Mouth/Throat: Mucous membranes are moist.  Oropharynx non-erythematous. Neck: No stridor.   Cardiovascular: Normal rate, regular rhythm. Grossly normal heart sounds.  Good peripheral circulation.  Patient is slightly hypertensive. Respiratory: Normal respiratory effort.  No retractions. Lungs CTAB. Gastrointestinal: Soft and nontender. No distention. No abdominal bruits. No CVA tenderness. GU: Musculoskeletal: No lower extremity tenderness nor edema.  No joint effusions. Neurologic:  Normal speech and language. No gross focal neurologic deficits are appreciated. No gait instability. Skin:  Skin is warm, dry and intact. No rash noted. Psychiatric: Mood and affect are normal. Speech and behavior are normal.  ____________________________________________   LABS (all labs ordered are listed, but only abnormal results are displayed)  Labs Reviewed  CBC WITH DIFFERENTIAL/PLATELET - Abnormal; Notable for the  following components:      Result Value   Hemoglobin 9.3 (*)    HCT 31.0 (*)    MCV 77.1 (*)    MCH 23.1 (*)    RDW 17.7 (*)    All other components within normal limits   ____________________________________________  EKG   ____________________________________________  RADIOLOGY  ED MD interpretation: Ultrasound results were reviewed by me and read by radiologist.  Official radiology report(s): US PELVIC COMPLETE WITH TRANSVAGINAL  Result Date: 09/28/2021 CLINICAL DATA:  Pain and irregular bleeding. EXAM: TRANSABDOMINAL AND TRANSVAGINAL ULTRASOUND OF PELVIS TECHNIQUE: Both transabdominal and transvaginal ultrasound examinations of the pelvis were performed. Transabdominal technique was performed for global imaging of the pelvis including uterus, ovaries, adnexal regions, and pelvic cul-de-sac. It was necessary to proceed with endovaginal exam following the transabdominal exam to visualize the endometrium, ovaries and adnexa. COMPARISON:  Pelvic ultrasound 08/17/2019 FINDINGS: Uterus Measurements: 11.2 x 5.5 x 6.5 cm = volume: 210  mL. The uterus is anteverted. There are multiple uterine fibroids, many of which are shadowing. Largest fibroid is at the left aspect of the fundus measuring 3.8 x 3.3 x 3.3 cm. This appears to be subserosal. There is a 2.9 x 2.7 x 2.5 cm partially exophytic hypoechoic fibroid from the posterior right uterine fundus. 2.1 x 2 x 1.5 cm hypoechoic fibroid is seen in the right anterior fundus, likely intramural. Endometrium Thickness: 10 mm. No evidence of focal endometrial lesion or fluid, however there is increased endometrial vascularity. Right ovary Not visualized.  No evidence of adnexal mass. Left ovary Not visualized.  No evidence of adnexal mass. Other findings No abnormal free fluid. IMPRESSION: 1. Multiple uterine fibroids, largest minimally increased in size from 2021 ultrasound. Fibroids appear to be intramural, partially exophytic or subserosal. 2. Normal  endometrial thickness of 10 mm, however there is increased endometrial vascularity. This is nonspecific, endometritis could have this appearance. 3. The ovaries are not visualized on the current exam. Electronically Signed   By: Keith Rake M.D.   On: 09/28/2021 19:28    ____________________________________________   PROCEDURES  Procedure(s) performed: None  Procedures  Critical Care performed: No  ____________________________________________   INITIAL IMPRESSION / ASSESSMENT AND PLAN / ED COURSE     Natalie Gregory is a 46 y.o. female presents to the emergency room for complaint of heavy vaginal bleeding for the past 4 days. Patient reports that for the past 2 weeks she has been having intermittent heavy vaginal bleeding. She reports that for the first 2 days she was having to change her tampon/pad every hour.  After the initial 2 days she is only having to change the pad/tampon about every 4 hours.  However, she reports that she was also passing significantly large blood clots until 2 days ago. Patient reports that approximately 9 months ago her primary care doctor, [facility], had started her on Depo for heavy menses.  She reports that until 2 weeks ago this had helped with her symptoms and she was only having intermittent spotting. Patient reports that several years ago she was diagnosed with uterine fibroids and is concerned that this could be a problem now. Patient is also complaining of significant amount of lower abdominal cramping.  She reports the abdominal cramping feels like "labor pains" she reports that the cramping is coming every 5 to 10 minutes. Patient reports that her tubes are tied therefore she cannot be pregnant. Patient reports that she has not taken anything for the pain at this time. Review of chart shows that patient had vaginal ultrasound on 08/17/2019 that showed multiple uterine fibroids.  Plan today will be to obtain CBC to make sure patient is not  anemic. I will also obtain vaginal ultrasound. Will also perform pelvic exam.  Patient's hemoglobin is noted to be 9.3.  Most recent comparison was 2 years ago when it was noted to be 11.2 Patient will be instructed to take her over-the-counter iron supplement daily until she can follow-up with her primary care provider.  She may also need follow-up iron studies.  She should also take an over-the-counter stool softener and increase her water intake as are very constipating.  Discussed this with patient.  Ultrasound reveals the following 1. Multiple uterine fibroids, largest minimally increased in size from 2021 ultrasound. Fibroids appear to be intramural, partially exophytic or subserosal. 2. Normal endometrial thickness of 10 mm, however there is increased endometrial vascularity. This is nonspecific, endometritis could have this appearance.  3. The ovaries are not visualized on the current exam  This is fairly consistent with ultrasound from 2021.  Vaginal exam shows minimal amount of bleeding in the vaginal vault.  There is no tenderness during exam.  Patient should follow-up with OB/GYN.  She will be provided with the number to call and schedule appointment at the first of the week. I should take anti-inflammatories for the cramping/discomfort. Do not feel that she needs Provera to stop the bleeding as there is minimal bleeding noted at this time.  And she reports that the bleeding has also decreased significantly in the last 2 days.  Patient will be discharged home in stable condition at this time.       ____________________________________________   FINAL CLINICAL IMPRESSION(S) / ED DIAGNOSES  Final diagnoses:  Menorrhagia with irregular cycle  Dysmenorrhea     ED Discharge Orders     None        Note:  This document was prepared using Dragon voice recognition software and may include unintentional dictation errors.     Willaim Rayas, NP 09/28/21 Laurence Ferrari, MD 09/29/21 [phone]

## 2021-09-28 NOTE — Discharge Instructions (Signed)
You have been seen today in the emergency room for heavy menstrual bleeding as well as cramping. You need to follow-up with your primary care doctor for decreased hemoglobin/anemia.  You may need further iron studies.  You should also start taking iron tablets daily.  You report that you have the supplements at home. For your abdominal cramping you should take anti-inflammatory medications. You have also been diagnosed with uterine fibroids.  They are unchanged, relatively, from 2021.  This could account for your heavy vaginal bleeding. You should call OB/GYN first thing Monday morning to make an appointment.

## 2021-09-28 NOTE — ED Triage Notes (Signed)
Pt here for menstrual cramps for 2 weeks. Pt needs an [place] but could not wait due to the pain.

## 2021-10-09 ENCOUNTER — Encounter: Payer: Self-pay | Admitting: Obstetrics & Gynecology

## 2021-10-09 ENCOUNTER — Ambulatory Visit: Payer: BLUE CROSS/BLUE SHIELD | Admitting: Obstetrics & Gynecology

## 2021-10-09 ENCOUNTER — Other Ambulatory Visit (HOSPITAL_COMMUNITY)
Admission: RE | Admit: 2021-10-09 | Discharge: 2021-10-09 | Disposition: A | Discharge status: Home / Self Care | Payer: BLUE CROSS/BLUE SHIELD | Source: Ambulatory Visit | Attending: Obstetrics & Gynecology | Admitting: Obstetrics & Gynecology

## 2021-10-09 VITALS — BP 144/86 | HR 64 | Wt 161.4 lb

## 2021-10-09 DIAGNOSIS — Z124 Encounter for screening for malignant neoplasm of cervix: Secondary | ICD-10-CM | POA: Insufficient documentation

## 2021-10-09 NOTE — Patient Instructions (Addendum)
Call for appt with next period You will have an endometrial biopsy the next day

## 2021-10-10 ENCOUNTER — Telehealth: Payer: Self-pay

## 2021-10-10 NOTE — Telephone Encounter (Signed)
Pt calling; was seen yesterday and was told to call when she started bleeding again.  Bleeding started yesterday.  [phone]

## 2021-10-10 NOTE — Telephone Encounter (Signed)
Contacted patient via phone to scheduled endometrial biopsy  with [facility] for 10/11/21 at 4:15 pm. I left message for patient to call back to be scheduled.

## 2021-10-11 ENCOUNTER — Encounter: Payer: Self-pay | Admitting: Obstetrics & Gynecology

## 2021-10-11 ENCOUNTER — Other Ambulatory Visit (HOSPITAL_COMMUNITY)
Admission: RE | Admit: 2021-10-11 | Discharge: 2021-10-11 | Disposition: A | Discharge status: Home / Self Care | Payer: BLUE CROSS/BLUE SHIELD | Source: Ambulatory Visit | Attending: Obstetrics & Gynecology | Admitting: Obstetrics & Gynecology

## 2021-10-11 ENCOUNTER — Ambulatory Visit: Payer: BLUE CROSS/BLUE SHIELD | Admitting: Obstetrics & Gynecology

## 2021-10-11 ENCOUNTER — Other Ambulatory Visit (HOSPITAL_COMMUNITY): Admission: RE | Admit: 2021-10-11 | Payer: BLUE CROSS/BLUE SHIELD | Source: Ambulatory Visit

## 2021-10-11 VITALS — BP 122/78 | Wt 159.0 lb

## 2021-10-11 DIAGNOSIS — D259 Leiomyoma of uterus, unspecified: Secondary | ICD-10-CM | POA: Diagnosis not present

## 2021-10-11 DIAGNOSIS — N921 Excessive and frequent menstruation with irregular cycle: Secondary | ICD-10-CM | POA: Insufficient documentation

## 2021-10-11 DIAGNOSIS — N944 Primary dysmenorrhea: Secondary | ICD-10-CM | POA: Diagnosis not present

## 2021-10-11 NOTE — Telephone Encounter (Signed)
Pt has been scheduled with Dr. Langley Gauss on 10/11/21 at 1:35.

## 2021-10-11 NOTE — Telephone Encounter (Signed)
Left voicemail to notify all of her message was not audible. Advised to return call, do not select option to speak to advice nurse as she will only be able to leave voicemail, please wait to speak with a scheduler when returning call.

## 2021-10-11 NOTE — Progress Notes (Signed)
Endometrial Biopsy Procedure Note  Pre-operative Diagnosis: L uteri, menorrhagia, dysmenorrhea  Post-operative Diagnosis: normal  Indications: abnormal uterine bleeding, enlarged uterus  Procedure Details   Urine pregnancy test was done yes and result was neg.  The risks (including infection, bleeding, pain, and uterine perforation) and benefits of the procedure were explained to the patient and Written informed consent was obtained.  Antibiotic prophylaxis against endocarditis was not indicated.   The patient was placed in the dorsal lithotomy position.  Bimanual exam showed the uterus to be in the neutral position.  A Graves' speculum inserted in the vagina, and the cervix prepped with povidone iodine.  Endocervical curettage with a Kevorkian curette was not performed.   A sharp tenaculum was applied to the anterior lip of the cervix for stabilization.  A sterile uterine sound was used to sound the uterus to a depth of 7cm.  A Pipelle endometrial aspirator was used to sample the endometrium.  Sample was sent for pathologic examination.  Condition: Stable  Complications: None  Plan:  The patient was advised to call for any fever or for prolonged or severe pain or bleeding. She was advised to use OTC ibuprofen as needed for mild to moderate pain. She was advised to avoid vaginal intercourse for 48 hours or until the bleeding has completely stopped.  Attending Physician Documentation: I was present for or participated in the entire procedure, including opening and closing.

## 2021-10-11 NOTE — Telephone Encounter (Signed)
[employer] VOICEMAIL: Patient calling about abnormal bleeding again. She reports she has to be at work @..... (Phone cut out) [phone]

## 2021-10-16 LAB — SURGICAL PATHOLOGY

## 2021-10-17 LAB — CYTOLOGY - PAP
Adequacy: ABSENT
Diagnosis: NEGATIVE

## 2021-10-22 ENCOUNTER — Telehealth: Payer: Self-pay

## 2021-10-22 NOTE — Telephone Encounter (Signed)
Left voicemail to notify of normal pap results. Next due in 3 years. Continue annual pelvic exams. Contact office with any questions/concerns.

## 2021-10-25 ENCOUNTER — Ambulatory Visit: Payer: BLUE CROSS/BLUE SHIELD | Admitting: Obstetrics & Gynecology

## 2021-10-29 ENCOUNTER — Ambulatory Visit: Payer: BLUE CROSS/BLUE SHIELD | Admitting: Obstetrics & Gynecology

## 2021-11-15 NOTE — Progress Notes (Signed)
Subjective:     Natalie Gregory is a 46 y.o. female here for a routine exam.  Current complaints: heavy vag bleeding with passage of clots.  Personal health questionnaire reviewed: yes.   Gynecologic History Patient's last menstrual period was 10/02/2021 (exact date). Contraception: none Last Pap: 05/2017. Results were: normal Last mammogram: none.   Obstetric History OB History  Gravida Para Term Preterm AB Living  '3 3 3     3  '$ SAB IAB Ectopic Multiple Live Births               # Outcome Date GA Lbr Len/2nd Weight Sex Delivery Anes PTL Lv  3 Term           2 Term           1 Term              The following portions of the patient's history were reviewed and updated as appropriate: allergies, current medications, past family history, past medical history, past social history, past surgical history, and problem list.  Review of Systems Pertinent items are noted in HPI.    Objective:    BP (!) 144/86   Pulse 64   Wt 161 lb 6.4 oz (73.2 kg)   LMP 10/02/2021 (Exact Date)   BMI 27.70 kg/m  General appearance: alert, cooperative, and no distress Abdomen: normal findings: no organomegaly and soft, non-tender Pelvic: cervix normal in appearance, external genitalia normal, no adnexal masses or tenderness, no cervical motion tenderness, uterus normal size, shape, and consistency, and vagina normal without discharge Extremities: extremities normal, atraumatic, no cyanosis or edema    Assessment:  Menorrhagia  Healthy female exam.    Plan:  Pelvic US review Schedule Embx w next period Pap done  Rosario Adie, MD  11/15/2021 10:19 AM

## 2021-11-20 ENCOUNTER — Ambulatory Visit: Payer: Self-pay | Admitting: Obstetrics and Gynecology

## 2022-01-06 NOTE — Progress Notes (Deleted)
    [facility], [place]   No chief complaint on file.   HPI:      [name] is a 46 y.o. [phone] whose LMP was No LMP recorded., presents today for *** Had EMB with r. 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   Graves' disease 10/31/2017   Hypertension, secondary 10/27/2017   Anxiety and depression 06/06/2017    Past Surgical History:  Procedure Laterality Date   CESAREAN SECTION      Family History  Problem Relation Age of Onset   Hypertension Mother    Diabetes Brother    Diabetes Maternal Aunt    Hypertension Maternal Aunt    Diabetes Maternal Uncle    Hypertension Maternal Uncle     Social History   Socioeconomic History   Marital status: Single    Spouse name: Not on file   Number of children: Not on file   Years of education: Not on file   Highest education level: Not on file  Occupational History   Not on file  Tobacco Use   Smoking status: Every Day    Packs/day: 0.50    Types: Cigarettes   Smokeless tobacco: Never  Vaping Use   Vaping Use: Never used  Substance and Sexual Activity   Alcohol use: Yes    Comment: 2/day   Drug use: No   Sexual activity: Not Currently    Birth control/protection: None  Other Topics Concern   Not on file  Social History Narrative   Not on file   Social Determinants of Health   Financial Resource Strain: Not on file  Food Insecurity: Not on file  Transportation Needs: Not on file  Physical Activity: Not on file  Stress: Not on file  Social Connections: Not on file  Intimate Partner Violence: Not on file    Outpatient Medications Prior to Visit  Medication Sig Dispense Refill   escitalopram (LEXAPRO) 5 MG tablet Take 5 mg by mouth daily.     ferrous gluconate (FERGON) 324 MG tablet Take 324 mg by mouth 2 (two) times daily.     ketoconazole (NIZORAL) 2  % cream Apply 1 application topically daily. 60 g 2   medroxyPROGESTERone (PROVERA) 10 MG tablet TAKE 2 TABLETS 3 TIMES A DAY FOR 7 DAYS , THEN 1 TAB DAILY FOR 10 DAYS FOR HEAVY BLEEDING 84 tablet 0   methimazole (TAPAZOLE) 5 MG tablet SMARTSIG:3 Tablet(s) By Mouth Every Other Day PRN     metoprolol succinate (TOPROL-XL) 50 MG 24 hr tablet Take 50 mg by mouth daily.     terbinafine (LAMISIL) 250 MG tablet TAKE 1 TABLET BY MOUTH EVERY DAY 30 tablet 0   No facility-administered medications prior to visit.      ROS:  Review of Systems BREAST: No symptoms   OBJECTIVE:   Vitals:  There were no vitals taken for this visit.  Physical Exam  Results: No results found for this or any previous visit (from the past 24 hour(s)).   Assessment/Plan: No diagnosis found.    No orders of the defined types were placed in this encounter.     No follow-ups on file.  Mackinze Criado B. Nicholaus Steinke, PA-C 01/06/2022 2:18 PM

## 2022-01-08 ENCOUNTER — Ambulatory Visit: Payer: Self-pay | Admitting: Obstetrics and Gynecology

## 2022-04-30 ENCOUNTER — Ambulatory Visit: Payer: Self-pay

## 2022-09-02 ENCOUNTER — Ambulatory Visit: Payer: 59 | Admitting: Obstetrics

## 2022-09-02 ENCOUNTER — Encounter: Payer: Self-pay | Admitting: Obstetrics

## 2022-09-02 VITALS — BP 167/95 | HR 85 | Ht 64.0 in | Wt 153.0 lb

## 2022-09-02 DIAGNOSIS — N920 Excessive and frequent menstruation with regular cycle: Secondary | ICD-10-CM

## 2022-09-02 DIAGNOSIS — D259 Leiomyoma of uterus, unspecified: Secondary | ICD-10-CM | POA: Insufficient documentation

## 2022-09-02 DIAGNOSIS — D500 Iron deficiency anemia secondary to blood loss (chronic): Secondary | ICD-10-CM

## 2022-09-02 DIAGNOSIS — D649 Anemia, unspecified: Secondary | ICD-10-CM | POA: Insufficient documentation

## 2022-09-02 NOTE — Progress Notes (Signed)
   GYN ENCOUNTER  Subjective  HPI: Natalie Gregory is a 47 y.o. [phone] who presents today for abnormal uterine bleeding. She reports that she has always had very heavy periods. She has to change a pad and tampon q1h during the heaviest days. She feels "drained" during her periods. She has tried Depo Provera and OCPs in the past with no improvement in her bleeding. She is not interested in an IUD. She has a documented history of fibroids. She had an endometrial biopsy last year, but the pathology results just show endocervical cells. She strongly desires a hysterectomy. Her BP is elevated today, and she states that she has not taken her metoprolol the past 2 days.  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 +fatigue  Genitourinary: See HPI    Objective  BP (!) 167/95   Pulse 85   Ht 5\' 4"  (1.626 m)   Wt 153 lb (69.4 kg)   LMP 08/19/2022   BMI 26.26 kg/m   Physical examination   Pelvic:   Vulva: Normal appearance.  No lesions.  Vagina: No lesions or abnormalities noted.  Support: Normal pelvic support.  Urethra No masses tenderness or scarring.  Meatus Normal size without lesions or prolapse.  Cervix: Difficulty visualizing  Anus: Normal exam.  No lesions.  Perineum: Normal exam.  No lesions.         Bimanual   Uterus: Normal size.  Non-tender.  Mobile.  AV. Mildly tender to palpation    Assessment  1)  Heavy menstrual bleeding, not improved with hormonal contraceptives 2) Anemia 3) Desires hysterectomy  Plan  1) Discussed non-surgical options for bleeding including IUD. CBC and pelvic US ordered. 2) Treat based on labs today. 3) Referral to MD for surgical consult. Encouraged smoking cessation and BP management  Guadlupe Spanish, CNM

## 2022-09-03 ENCOUNTER — Other Ambulatory Visit: Payer: Self-pay | Admitting: Obstetrics

## 2022-09-03 LAB — CBC
Hematocrit: 24.4 % — ABNORMAL LOW (ref 34.0–46.6)
Hemoglobin: 6.8 g/dL — CL (ref 11.1–15.9)
MCH: 20.2 pg — ABNORMAL LOW (ref 26.6–33.0)
MCHC: 27.9 g/dL — ABNORMAL LOW (ref 31.5–35.7)
MCV: 72 fL — ABNORMAL LOW (ref 79–97)
Platelets: 320 10*3/uL (ref 150–450)
RBC: 3.37 x10E6/uL — ABNORMAL LOW (ref 3.77–5.28)
RDW: 16.8 % — ABNORMAL HIGH (ref 11.7–15.4)
WBC: 6.2 10*3/uL (ref 3.4–10.8)

## 2022-09-05 ENCOUNTER — Other Ambulatory Visit: Payer: Self-pay | Admitting: *Deleted

## 2022-09-05 ENCOUNTER — Other Ambulatory Visit: Payer: Self-pay | Admitting: Obstetrics

## 2022-09-05 DIAGNOSIS — D649 Anemia, unspecified: Secondary | ICD-10-CM

## 2022-09-05 MED ORDER — SODIUM CHLORIDE 0.9 % IV SOLN: 25.0000 mg | Freq: Once | INTRAVENOUS | Status: DC

## 2022-09-05 MED ORDER — SODIUM CHLORIDE 0.9 % IV SOLN: 100.0000 mg | Freq: Once | INTRAVENOUS | Status: DC

## 2022-09-05 NOTE — Progress Notes (Signed)
Talked to [place] about hgb of 6.8. Recommend iron infusion and possible blood transfusion. She is in agreement with this plan. [facility] contacted to set up iron infusion and referral placed to Hematology.  Glenetta Borg, CNM

## 2022-09-06 ENCOUNTER — Other Ambulatory Visit: Payer: 59

## 2022-09-06 ENCOUNTER — Encounter: Payer: Self-pay | Admitting: Oncology

## 2022-09-06 ENCOUNTER — Inpatient Hospital Stay: Payer: 59

## 2022-09-06 ENCOUNTER — Inpatient Hospital Stay: Payer: 59 | Attending: Oncology | Admitting: Oncology

## 2022-09-06 VITALS — BP 146/84 | HR 82 | Resp 18

## 2022-09-06 VITALS — BP 142/62 | HR 68 | Temp 97.4°F | Resp 18 | Ht 64.0 in | Wt 153.1 lb

## 2022-09-06 DIAGNOSIS — F17210 Nicotine dependence, cigarettes, uncomplicated: Secondary | ICD-10-CM | POA: Insufficient documentation

## 2022-09-06 DIAGNOSIS — D508 Other iron deficiency anemias: Secondary | ICD-10-CM

## 2022-09-06 DIAGNOSIS — D509 Iron deficiency anemia, unspecified: Secondary | ICD-10-CM | POA: Insufficient documentation

## 2022-09-06 DIAGNOSIS — D649 Anemia, unspecified: Secondary | ICD-10-CM

## 2022-09-06 DIAGNOSIS — N920 Excessive and frequent menstruation with regular cycle: Secondary | ICD-10-CM | POA: Insufficient documentation

## 2022-09-06 LAB — IRON AND TIBC
Iron: 21 ug/dL — ABNORMAL LOW (ref 28–170)
Saturation Ratios: 4 % — ABNORMAL LOW (ref 10.4–31.8)
TIBC: 531 ug/dL — ABNORMAL HIGH (ref 250–450)
UIBC: 510 ug/dL

## 2022-09-06 LAB — CBC
HCT: 23.6 % — ABNORMAL LOW (ref 36.0–46.0)
Hemoglobin: 6.7 g/dL — CL (ref 12.0–15.0)
MCH: 19.8 pg — ABNORMAL LOW (ref 26.0–34.0)
MCHC: 28.4 g/dL — ABNORMAL LOW (ref 30.0–36.0)
MCV: 69.8 fL — ABNORMAL LOW (ref 80.0–100.0)
Platelets: 246 10*3/uL (ref 150–400)
RBC: 3.38 MIL/uL — ABNORMAL LOW (ref 3.87–5.11)
RDW: 18.9 % — ABNORMAL HIGH (ref 11.5–15.5)
WBC: 5.8 10*3/uL (ref 4.0–10.5)
nRBC: 0 % (ref 0.0–0.2)

## 2022-09-06 LAB — VITAMIN B12: Vitamin B-12: 130 pg/mL — ABNORMAL LOW (ref 180–914)

## 2022-09-06 LAB — FERRITIN: Ferritin: 3 ng/mL — ABNORMAL LOW (ref 11–307)

## 2022-09-06 MED ORDER — SODIUM CHLORIDE 0.9 % IV SOLN
200.0000 mg | INTRAVENOUS | Status: DC
  Administered 2022-09-06: 200 mg via INTRAVENOUS
  Filled 2022-09-06: qty 200

## 2022-09-06 MED ORDER — SODIUM CHLORIDE 0.9 % IV SOLN
Freq: Once | INTRAVENOUS | Status: AC
  Filled 2022-09-06: qty 250

## 2022-09-06 NOTE — Progress Notes (Signed)
Hematology/Oncology Consult note [facility] Telephone:([phone] [phone]  Patient Care Team: Center, [facility] as PCP - General (General Practice)   Name of the patient: Natalie Gregory  [id number]  1975-08-31    Reason for referral-microcytic anemia   Referring physician-[facility]  Date of visit: 09/06/22   History of presenting illness-patient is a 47 year old African-American female with past medical history significant for menorrhagia for which she is undergoing workup with GYN.  She had a endometrial biopsy recently which was nondiagnostic.  She has tried various other measures including birth control for menorrhagia but it has not helped.  Patient is hoping to undergo hysterectomy soon but has been referred to [place] for her microcytic anemia.Most recent CBC the from about a week ago showed H&H of 6.8/24.4 with an MCV of 72.  White count and platelets were normal.  Repeat CBC today shows similar hemoglobin.  Iron studies from today are also indicated of iron deficiency anemia with a ferritin level of 3 and an iron saturation of 4%.  Patient reports feeling fatigued.  Her menstrual cycles continue to be heavy.  Denies any blood loss in her stool or urine.  Denies any dark melanotic stools.  Denies any family history of colon cancer.  ECOG PS- 0  Pain scale- 0   Review of systems- Review of Systems  Constitutional:  Positive for malaise/fatigue. Negative for chills, fever and weight loss.  HENT:  Negative for congestion, ear discharge and nosebleeds.   Eyes:  Negative for blurred vision.  Respiratory:  Negative for cough, hemoptysis, sputum production, shortness of breath and wheezing.   Cardiovascular:  Negative for chest pain, palpitations, orthopnea and claudication.  Gastrointestinal:  Negative for abdominal pain, blood in stool, constipation, diarrhea, heartburn, melena, nausea and vomiting.  Genitourinary:   Negative for dysuria, flank pain, frequency, hematuria and urgency.  Musculoskeletal:  Negative for back pain, joint pain and myalgias.  Skin:  Negative for rash.  Neurological:  Negative for dizziness, tingling, focal weakness, seizures, weakness and headaches.  Endo/Heme/Allergies:  Does not bruise/bleed easily.  Psychiatric/Behavioral:  Negative for depression and suicidal ideas. The patient does not have insomnia.     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)    Hyperthyroidism    Trichomonal vulvovaginitis      Past Surgical History:  Procedure Laterality Date   CESAREAN SECTION      Social History   Socioeconomic History   Marital status: Single    Spouse name: Not on file   Number of children: Not on file   Years of education: Not on file   Highest education level: Not on file  Occupational History   Not on file  Tobacco Use   Smoking status: Every Day    Current packs/day: 0.50    Types: Cigarettes   Smokeless tobacco: Never  Vaping Use   Vaping status:  Never Used  Substance and Sexual Activity   Alcohol use: Yes    Comment: 2/day   Drug use: No   Sexual activity: Not Currently    Birth control/protection: None  Other Topics Concern   Not on file  Social History Narrative   Not on file   Social Determinants of Health   Financial Resource Strain: Medium Risk (09/03/2019)    Received from [facility], [facility]   Overall Financial Resource Strain (CARDIA)    Difficulty of Paying Living Expenses: Somewhat hard  Food Insecurity: No Food Insecurity (09/03/2019)   Received from [facility], [facility]   Hunger Vital Sign    Worried About Running Out of Food in the Last Year: Never true    Ran Out of Food in the Last Year: Never true  Transportation Needs: No Transportation Needs (09/03/2019)   Received from [facility], [facility]   [facility] - Transportation    In the past 12 months, has lack of transportation kept you from medical appointments or from getting medications?: No    Lack of Transportation (Non-Medical): No  Physical Activity: Inactive (09/03/2019)   Received from [facility], [facility]   Exercise Vital Sign    Days of Exercise per Week: 0 days    Minutes of Exercise per Session: 0 min  Stress: No Stress Concern Present (09/03/2019)   Received from [facility], [facility]   [employer] of Occupational Health - Occupational Stress Questionnaire    Feeling of Stress : Only a little  Social Connections: Moderately Isolated (09/03/2019)   Received from [facility], [facility]   Social Connection and Isolation Panel [NHANES]    Frequency of Communication with Friends and Family: More than three times a week    Frequency of Social Gatherings with Friends and Family: Three times a week    Attends Religious Services: More than 4 times per year    Active Member of Clubs or Organizations: No    Attends Banker Meetings: Never    Marital Status: Never married  Catering manager Violence: Not on file     Family History  Problem Relation Age of Onset   Hypertension Mother    Diabetes Brother    Diabetes Maternal Aunt     Hypertension Maternal Aunt    Diabetes Maternal Uncle    Hypertension Maternal Uncle      Current Outpatient Medications:    methimazole (TAPAZOLE) 5 MG tablet, SMARTSIG:3 Tablet(s) By Mouth Every Other Day PRN, Disp: , Rfl:    metoprolol succinate (TOPROL-XL) 50 MG 24 hr tablet, Take 50 mg by mouth daily., Disp: , Rfl:    escitalopram (LEXAPRO) 5 MG tablet, Take 5 mg by mouth daily. (Patient not taking: Reported on 09/02/2022), Disp: , Rfl:    ferrous gluconate (FERGON) 324 MG tablet, Take 324 mg by mouth 2 (two) times daily. (Patient not taking: Reported on 09/02/2022), Disp: , Rfl:    ketoconazole (NIZORAL) 2 % cream, Apply 1 application topically daily. (Patient not taking: Reported on 09/02/2022), Disp: 60 g, Rfl: 2   medroxyPROGESTERone (PROVERA) 10 MG tablet, TAKE 2 TABLETS 3 TIMES A DAY FOR 7 DAYS , THEN 1 TAB DAILY FOR 10 DAYS FOR HEAVY BLEEDING (Patient not taking: Reported on 09/02/2022), Disp: 84 tablet, Rfl: 0   terbinafine (LAMISIL) 250 MG tablet, TAKE  1 TABLET BY MOUTH EVERY DAY (Patient not taking: Reported on 09/06/2022), Disp: 30 tablet, Rfl: 0 No current facility-administered medications for this visit.  Facility-Administered Medications Ordered in Other Visits:    iron sucrose (VENOFER) 200 mg in sodium chloride 0.9 % 100 mL IVPB, 200 mg, Intravenous, Weekly, Creig Hines, MD, Last Rate: 440 mL/hr at 09/06/22 1036, 200 mg at 09/06/22 1036   Physical exam:  Vitals:   09/06/22 0925 09/06/22 0932 09/06/22 0934 09/06/22 0939  BP: (!) 162/86 (!) 164/96 (!) 165/92 (!) 142/62  Pulse: 69 68 68   Resp: 18     Temp: (!) 97.4 F (36.3 C)     TempSrc: Tympanic     Weight: 153 lb 1.6 oz (69.4 kg)     Height: 5\' 4"  (1.626 m)      Physical Exam Cardiovascular:     Rate and Rhythm: Normal rate and regular rhythm.     Heart sounds: Normal heart sounds.  Pulmonary:     Effort: Pulmonary effort is normal.     Breath sounds: Normal breath sounds.  Abdominal:     General: Bowel  sounds are normal.     Palpations: Abdomen is soft.  Skin:    General: Skin is warm and dry.  Neurological:     Mental Status: She is alert and oriented to person, place, and time.           Latest Ref Rng & Units 08/17/2019    1:59 PM  CMP  Glucose 70 - 99 mg/dL 94   BUN 6 - 20 mg/dL 9   Creatinine 9.60 - 4.54 mg/dL 0.98   Sodium 119 - 147 mmol/L 138   Potassium 3.5 - 5.1 mmol/L 4.2   Chloride 98 - 111 mmol/L 103   CO2 22 - 32 mmol/L 27   Calcium 8.9 - 10.3 mg/dL 8.7   Total Protein 6.5 - 8.1 g/dL 7.5   Total Bilirubin 0.3 - 1.2 mg/dL 0.7   Alkaline Phos 38 - 126 U/L 68   AST 15 - 41 U/L 40   ALT 0 - 44 U/L 33       Latest Ref Rng & Units 09/06/2022    8:28 AM  CBC  WBC 4.0 - 10.5 K/uL 5.8   Hemoglobin 12.0 - 15.0 g/dL 6.7   Hematocrit 82.9 - 46.0 % 23.6   Platelets 150 - 400 K/uL 246     Assessment and plan- Patient is a 47 y.o. female with severe iron deficiency anemia likely secondary to menorrhagia  Iron studies today are clearly indicative of iron deficiency anemia likely secondary to menorrhagia.  Although her hemoglobin is less than 7 patient is young with a good cardiovascular reserve and I do not see a reason to offer her PRBC transfusions today we have moving forward with iron infusions and she will get her first dose of Venofer today.  We will schedule her for 4 more doses over the next 1 to 2 weeks.  CBC ferritin and iron studies in 4 weeks and 8 weeks and I will see her back in 8 weeks.  Hopefully once her hemoglobin rises to about 10 or so she can proceed with her hysterectomy.  It is unlikely that we can totally correct her iron deficiency anemia without definitive management for her menorrhagia.  If iron deficiency persists despite hysterectomy I will refer her to GI at that time.  B12 levels from today are also pending.  Discussed risks and benefits  of IV iron including all but not limited to possible risk of infusion reaction.  Patient understands and agrees  to proceed as planned.   Thank you for this kind referral and the opportunity to participate in the care of this  Patient   Visit Diagnosis 1. Other iron deficiency anemia     Dr. Owens Shark, MD, MPH [facility] [phone] 09/06/2022

## 2022-09-09 ENCOUNTER — Ambulatory Visit: Admission: RE | Admit: 2022-09-09 | Payer: 59 | Source: Ambulatory Visit

## 2022-09-12 MED FILL — Iron Sucrose Inj 20 MG/ML (Fe Equiv): INTRAVENOUS | Qty: 10 | Status: AC

## 2022-09-13 ENCOUNTER — Inpatient Hospital Stay: Payer: 59

## 2022-09-13 ENCOUNTER — Inpatient Hospital Stay: Payer: 59 | Attending: Oncology

## 2022-09-13 VITALS — BP 123/74 | HR 80 | Temp 99.0°F | Resp 18

## 2022-09-13 DIAGNOSIS — D509 Iron deficiency anemia, unspecified: Secondary | ICD-10-CM | POA: Insufficient documentation

## 2022-09-13 DIAGNOSIS — N920 Excessive and frequent menstruation with regular cycle: Secondary | ICD-10-CM | POA: Insufficient documentation

## 2022-09-13 DIAGNOSIS — D508 Other iron deficiency anemias: Secondary | ICD-10-CM

## 2022-09-13 MED ORDER — SODIUM CHLORIDE 0.9 % IV SOLN
Freq: Once | INTRAVENOUS | Status: AC
  Filled 2022-09-13: qty 250

## 2022-09-13 MED ORDER — SODIUM CHLORIDE 0.9% FLUSH
10.0000 mL | Freq: Once | INTRAVENOUS | Status: AC | PRN
  Administered 2022-09-13: 10 mL
  Filled 2022-09-13: qty 10

## 2022-09-13 MED ORDER — SODIUM CHLORIDE 0.9 % IV SOLN
200.0000 mg | INTRAVENOUS | Status: DC
  Administered 2022-09-13: 200 mg via INTRAVENOUS
  Filled 2022-09-13: qty 200

## 2022-09-13 NOTE — Patient Instructions (Signed)
Iron Sucrose Injection What is this medication? IRON SUCROSE (EYE ern SOO krose) treats low levels of iron (iron deficiency anemia) in people with kidney disease. Iron is a mineral that plays an important role in making red blood cells, which carry oxygen from your lungs to the rest of your body. This medicine may be used for other purposes; ask your health care provider or pharmacist if you have questions. COMMON BRAND NAME(S): Venofer What should I tell my care team before I take this medication? They need to know if you have any of these conditions: Anemia not caused by low iron levels Heart disease High levels of iron in the blood Kidney disease Liver disease An unusual or allergic reaction to iron, other medications, foods, dyes, or preservatives Pregnant or trying to get pregnant Breastfeeding How should I use this medication? This medication is for infusion into a vein. It is given in a hospital or clinic setting. Talk to your care team about the use of this medication in children. While this medication may be prescribed for children as young as 2 years for selected conditions, precautions do apply. Overdosage: If you think you have taken too much of this medicine contact a poison control center or emergency room at once. NOTE: This medicine is only for you. Do not share this medicine with others. What if I miss a dose? Keep appointments for follow-up doses. It is important not to miss your dose. Call your care team if you are unable to keep an appointment. What may interact with this medication? Do not take this medication with any of the following: Deferoxamine Dimercaprol Other iron products This medication may also interact with the following: Chloramphenicol Deferasirox This list may not describe all possible interactions. Give your health care provider a list of all the medicines, herbs, non-prescription drugs, or dietary supplements you use. Also tell them if you smoke,  drink alcohol, or use illegal drugs. Some items may interact with your medicine. What should I watch for while using this medication? Visit your care team regularly. Tell your care team if your symptoms do not start to get better or if they get worse. You may need blood work done while you are taking this medication. You may need to follow a special diet. Talk to your care team. Foods that contain iron include: whole grains/cereals, dried fruits, beans, or peas, leafy green vegetables, and organ meats (liver, kidney). What side effects may I notice from receiving this medication? Side effects that you should report to your care team as soon as possible: Allergic reactions--skin rash, itching, hives, swelling of the face, lips, tongue, or throat Low blood pressure--dizziness, feeling faint or lightheaded, blurry vision Shortness of breath Side effects that usually do not require medical attention (report to your care team if they continue or are bothersome): Flushing Headache Joint pain Muscle pain Nausea Pain, redness, or irritation at injection site This list may not describe all possible side effects. Call your doctor for medical advice about side effects. You may report side effects to FDA at 1-800-FDA-1088. Where should I keep my medication? This medication is given in a hospital or clinic. It will not be stored at home. NOTE: This sheet is a summary. It may not cover all possible information. If you have questions about this medicine, talk to your doctor, pharmacist, or health care provider.  2024 Elsevier/Gold Standard (2022-07-05 00:00:00)

## 2022-09-13 NOTE — Progress Notes (Signed)
Patient tolerated Venofer infusion well, no questions/concerns voiced. Monitored 30 min post transfusion. Patient stable at discharge. VSS. AVS given.

## 2022-09-17 ENCOUNTER — Telehealth: Payer: Self-pay | Admitting: Oncology

## 2022-09-17 NOTE — Telephone Encounter (Signed)
This patient left a VM on my phone.Marland Kitchen She wanted to know if her FMLA was approved b/c she has an appt Friday. [phone] I sent message to her medical team.

## 2022-09-18 NOTE — Telephone Encounter (Signed)
I called the patient back today and got her voicemail.  I told her that I did finish the FMLA for her.  I think there is one place that might be that we fill out when she come back to work.  But she is on intermittent and we written down what times and dates for her to be out of work while she is getting her iron treatments.  I also told the patient that I will go ahead and fax it in but there is some places that the patient has to fill in some of her portion and signature.  Patient will be given the form to herself on Friday

## 2022-09-20 ENCOUNTER — Inpatient Hospital Stay: Payer: 59

## 2022-09-20 ENCOUNTER — Other Ambulatory Visit: Payer: Self-pay | Admitting: Obstetrics

## 2022-09-20 ENCOUNTER — Ambulatory Visit (INDEPENDENT_AMBULATORY_CARE_PROVIDER_SITE_OTHER): Payer: 59

## 2022-09-20 ENCOUNTER — Other Ambulatory Visit: Payer: Self-pay | Admitting: Oncology

## 2022-09-20 ENCOUNTER — Telehealth: Payer: Self-pay

## 2022-09-20 VITALS — BP 146/85 | HR 72 | Resp 16

## 2022-09-20 DIAGNOSIS — N939 Abnormal uterine and vaginal bleeding, unspecified: Secondary | ICD-10-CM

## 2022-09-20 DIAGNOSIS — D500 Iron deficiency anemia secondary to blood loss (chronic): Secondary | ICD-10-CM

## 2022-09-20 DIAGNOSIS — D508 Other iron deficiency anemias: Secondary | ICD-10-CM

## 2022-09-20 DIAGNOSIS — N920 Excessive and frequent menstruation with regular cycle: Secondary | ICD-10-CM

## 2022-09-20 DIAGNOSIS — D509 Iron deficiency anemia, unspecified: Secondary | ICD-10-CM | POA: Diagnosis not present

## 2022-09-20 MED ORDER — SODIUM CHLORIDE 0.9 % IV SOLN
200.0000 mg | INTRAVENOUS | Status: DC
  Administered 2022-09-20: 200 mg via INTRAVENOUS
  Filled 2022-09-20: qty 200

## 2022-09-20 MED ORDER — SODIUM CHLORIDE 0.9 % IV SOLN
Freq: Once | INTRAVENOUS | Status: AC
  Filled 2022-09-20: qty 250

## 2022-09-20 MED ORDER — CYANOCOBALAMIN 1000 MCG/ML IJ SOLN
1000.0000 ug | Freq: Once | INTRAMUSCULAR | Status: AC
  Administered 2022-09-20: 1000 ug via INTRAMUSCULAR
  Filled 2022-09-20: qty 1

## 2022-09-20 NOTE — Telephone Encounter (Signed)
Faxed FMLA paper work 09/20/22 to [employer] fax#[phone] claim [phone]

## 2022-09-24 ENCOUNTER — Encounter: Payer: Self-pay | Admitting: Obstetrics and Gynecology

## 2022-09-24 ENCOUNTER — Ambulatory Visit (INDEPENDENT_AMBULATORY_CARE_PROVIDER_SITE_OTHER): Payer: 59 | Admitting: Obstetrics and Gynecology

## 2022-09-24 VITALS — BP 157/88 | HR 76 | Ht 64.0 in | Wt 151.0 lb

## 2022-09-24 DIAGNOSIS — N920 Excessive and frequent menstruation with regular cycle: Secondary | ICD-10-CM

## 2022-09-24 DIAGNOSIS — D259 Leiomyoma of uterus, unspecified: Secondary | ICD-10-CM | POA: Diagnosis not present

## 2022-09-24 NOTE — Progress Notes (Signed)
HPI:      [name] is a 47 y.o. [phone] who LMP was Patient's last menstrual period was 09/15/2022 (exact date).  Subjective:   She presents today to discuss her fibroids.  She has been having very heavy menstrual bleeding over the last several years.  She drops her blood count with each menses.  She has been receiving iron infusions for this.  In the past she has tried Depo-Provera and OCPs.  Most recently she has tried Micronor without success.    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 She does not have any pertinent problems on file. She  has a past surgical history that includes Cesarean section. Her family history includes Diabetes in her brother, maternal aunt, and maternal uncle; Hypertension in her maternal aunt, maternal uncle, and mother. She  reports that she has been smoking cigarettes. She has never used smokeless tobacco. She reports current alcohol use. She reports that she does not use drugs. She has a current medication list which includes the following prescription(s): methimazole, metoprolol succinate, escitalopram, ferrous gluconate, ketoconazole, and terbinafine. She has No Known Allergies.       Review of Systems:  Review of Systems  Constitutional: Denied constitutional symptoms, night sweats, recent illness, fatigue, fever, insomnia and weight loss.  Eyes: Denied eye symptoms, eye pain, photophobia, vision change and visual disturbance.  Ears/Nose/Throat/Neck: Denied ear, nose, throat or neck symptoms, hearing loss, nasal discharge, sinus congestion and sore throat.   Cardiovascular: Denied cardiovascular symptoms, arrhythmia, chest pain/pressure, edema, exercise intolerance, orthopnea and palpitations.  Respiratory: Denied pulmonary symptoms, asthma, pleuritic pain, productive sputum, cough, dyspnea and wheezing.  Gastrointestinal: Denied, gastro-esophageal reflux, melena, nausea and vomiting.  Genitourinary: See HPI for additional information.  Musculoskeletal: Denied musculoskeletal symptoms, stiffness, swelling, muscle weakness and myalgia.  Dermatologic: Denied dermatology symptoms, rash and scar.  Neurologic: Denied neurology symptoms, dizziness, headache, neck pain and syncope.  Psychiatric: Denied psychiatric symptoms, anxiety and depression.  Endocrine: Denied endocrine symptoms including hot flashes and night sweats.   Meds:   Current Outpatient Medications on File Prior to Visit  Medication Sig Dispense Refill   methimazole (TAPAZOLE) 5 MG tablet SMARTSIG:3 Tablet(s) By Mouth Every Other Day PRN     metoprolol succinate (TOPROL-XL) 50 MG 24 hr tablet Take 50 mg by mouth daily.     escitalopram (LEXAPRO) 5 MG tablet Take 5 mg by mouth daily. (Patient not taking: Reported on 09/02/2022)     ferrous gluconate (FERGON) 324 MG tablet Take 324 mg by mouth 2 (two) times daily. (Patient not taking: Reported on 09/02/2022)     ketoconazole (NIZORAL) 2 % cream Apply 1 application topically daily. (Patient not taking: Reported on 09/02/2022) 60 g 2   terbinafine (LAMISIL) 250 MG tablet TAKE 1 TABLET BY MOUTH EVERY DAY (Patient not taking: Reported on 09/06/2022) 30 tablet 0   No current facility-administered medications on file prior to visit.      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 Menorrhagia with regular cycle   2. Leiomyoma of body of uterus     Fibroids most likely causing her heavy menstrual bleeding.  She has failed multiple hormonal cycle control methods.  She has a tubal ligation and has completed childbearing.   Plan:            1.  We have discussed uterine fibroids in detail.  The natural course and history of uterine fibroids and menopausal effect on fibroids have been discussed.  Multiple methods of cycle control were also discussed including IUD.  Endometrial ablation, UFE and hysterectomy were also discussed.  Patient states that she is strongly considering hysterectomy.  She does not know at this time if she would like to keep her ovaries or have them removed at the time of surgery. She is desirous of her surgery in October. Orders No orders of the defined types were placed in this encounter.   No orders of the defined types were placed in this encounter.     F/U  Return in about 7 weeks (around 11/12/2022).  For preop I spent 30 minutes involved in the care of this patient preparing to see the patient by obtaining and reviewing her medical history (including labs, imaging tests and prior procedures), documenting clinical information in the electronic health record (EHR), counseling and coordinating care plans, writing and sending prescriptions, ordering tests or procedures and in direct communicating with the patient and medical staff discussing pertinent items from her history and physical exam.  Elonda Husky, M.D. 09/24/2022 10:36 AM

## 2022-09-24 NOTE — Progress Notes (Signed)
Patient presents today to discuss a hysterectomy. She states 2-3 years of heavy cycles along with moderate cramping. Patient also has a history of fibroids.

## 2022-09-27 ENCOUNTER — Inpatient Hospital Stay: Payer: 59

## 2022-09-27 VITALS — BP 153/81 | HR 60

## 2022-09-27 DIAGNOSIS — D509 Iron deficiency anemia, unspecified: Secondary | ICD-10-CM | POA: Diagnosis not present

## 2022-09-27 DIAGNOSIS — D508 Other iron deficiency anemias: Secondary | ICD-10-CM

## 2022-09-27 MED ORDER — SODIUM CHLORIDE 0.9 % IV SOLN
Freq: Once | INTRAVENOUS | Status: AC
  Filled 2022-09-27: qty 250

## 2022-09-27 MED ORDER — SODIUM CHLORIDE 0.9 % IV SOLN
200.0000 mg | INTRAVENOUS | Status: DC
  Administered 2022-09-27: 200 mg via INTRAVENOUS
  Filled 2022-09-27: qty 200

## 2022-09-27 MED ORDER — CYANOCOBALAMIN 1000 MCG/ML IJ SOLN
1000.0000 ug | Freq: Once | INTRAMUSCULAR | Status: AC
  Administered 2022-09-27: 1000 ug via INTRAMUSCULAR
  Filled 2022-09-27: qty 1

## 2022-10-04 ENCOUNTER — Inpatient Hospital Stay: Payer: 59

## 2022-10-04 VITALS — BP 141/82 | HR 82 | Temp 98.4°F

## 2022-10-04 DIAGNOSIS — D508 Other iron deficiency anemias: Secondary | ICD-10-CM

## 2022-10-04 DIAGNOSIS — D509 Iron deficiency anemia, unspecified: Secondary | ICD-10-CM | POA: Diagnosis not present

## 2022-10-04 LAB — IRON AND TIBC
Iron: 48 ug/dL (ref 28–170)
Saturation Ratios: 10 % — ABNORMAL LOW (ref 10.4–31.8)
TIBC: 475 ug/dL — ABNORMAL HIGH (ref 250–450)
UIBC: 427 ug/dL

## 2022-10-04 LAB — CBC
HCT: 32.5 % — ABNORMAL LOW (ref 36.0–46.0)
Hemoglobin: 9.7 g/dL — ABNORMAL LOW (ref 12.0–15.0)
MCH: 23.7 pg — ABNORMAL LOW (ref 26.0–34.0)
MCHC: 29.8 g/dL — ABNORMAL LOW (ref 30.0–36.0)
MCV: 79.5 fL — ABNORMAL LOW (ref 80.0–100.0)
Platelets: 225 10*3/uL (ref 150–400)
RBC: 4.09 MIL/uL (ref 3.87–5.11)
RDW: 27.4 % — ABNORMAL HIGH (ref 11.5–15.5)
WBC: 5 10*3/uL (ref 4.0–10.5)
nRBC: 0 % (ref 0.0–0.2)

## 2022-10-04 LAB — FERRITIN: Ferritin: 48 ng/mL (ref 11–307)

## 2022-10-04 MED ORDER — CYANOCOBALAMIN 1000 MCG/ML IJ SOLN
1000.0000 ug | Freq: Once | INTRAMUSCULAR | Status: AC
  Administered 2022-10-04: 1000 ug via INTRAMUSCULAR
  Filled 2022-10-04: qty 1

## 2022-10-04 MED ORDER — SODIUM CHLORIDE 0.9 % IV SOLN
Freq: Once | INTRAVENOUS | Status: AC
  Filled 2022-10-04: qty 250

## 2022-10-04 MED ORDER — SODIUM CHLORIDE 0.9 % IV SOLN
200.0000 mg | INTRAVENOUS | Status: DC
  Administered 2022-10-04: 200 mg via INTRAVENOUS
  Filled 2022-10-04: qty 200

## 2022-10-09 ENCOUNTER — Encounter: Payer: Self-pay | Admitting: Obstetrics and Gynecology

## 2022-10-09 ENCOUNTER — Telehealth: Payer: Self-pay

## 2022-10-09 ENCOUNTER — Ambulatory Visit (INDEPENDENT_AMBULATORY_CARE_PROVIDER_SITE_OTHER): Payer: 59 | Admitting: Obstetrics and Gynecology

## 2022-10-09 VITALS — BP 147/83 | HR 80 | Ht 64.0 in | Wt 149.3 lb

## 2022-10-09 DIAGNOSIS — N920 Excessive and frequent menstruation with regular cycle: Secondary | ICD-10-CM

## 2022-10-09 DIAGNOSIS — Z01818 Encounter for other preprocedural examination: Secondary | ICD-10-CM

## 2022-10-09 DIAGNOSIS — D259 Leiomyoma of uterus, unspecified: Secondary | ICD-10-CM

## 2022-10-09 MED ORDER — METRONIDAZOLE 500 MG PO TABS: 500.0000 mg | ORAL_TABLET | Freq: Two times a day (BID) | ORAL | 0 refills | Status: DC

## 2022-10-09 NOTE — Progress Notes (Signed)
Patient presents today for a pre-op exam prior to hysterectomy. She states no additional concerns today.

## 2022-10-09 NOTE — H&P (Signed)
PRE-OPERATIVE HISTORY AND PHYSICAL EXAM  PCP:  [facility] Subjective:   HPI:  Natalie Gregory is a 47 y.o. [phone].  Patient's last menstrual period was 09/15/2022 (exact date).  She presents today for a pre-op discussion and PE.  She has been having very heavy menstrual bleeding over the last several years. She drops her blood count with each menses. She has been receiving iron infusions for this. In the past she has tried Depo-Provera and OCPs. Most recently she has tried Micronor without success.   Review of Systems:   Constitutional: Denied constitutional symptoms, night sweats, recent illness, fatigue, fever, insomnia and weight loss.  Eyes: Denied eye symptoms, eye pain, photophobia, vision change and visual disturbance.  Ears/Nose/Throat/Neck: Denied ear, nose, throat or neck symptoms, hearing loss, nasal discharge, sinus congestion and sore throat.  Cardiovascular: Denied cardiovascular symptoms, arrhythmia, chest pain/pressure, edema, exercise intolerance, orthopnea and palpitations.  Respiratory: Denied pulmonary symptoms, asthma, pleuritic pain, productive sputum, cough, dyspnea and wheezing.  Gastrointestinal: Denied, gastro-esophageal reflux, melena, nausea and vomiting.  Genitourinary: See HPI for additional information.  Musculoskeletal: Denied musculoskeletal symptoms, stiffness, swelling, muscle weakness and myalgia.  Dermatologic: Denied dermatology symptoms, rash and scar.  Neurologic: Denied neurology symptoms, dizziness, headache, neck pain and syncope.  Psychiatric: Denied psychiatric symptoms, anxiety and depression.  Endocrine: Denied endocrine symptoms including hot flashes and night sweats.   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)    Hyperthyroidism    Trichomonal vulvovaginitis     Past Surgical History:  Procedure Laterality Date   CESAREAN SECTION        SOCIAL HISTORY:  Social History   Tobacco Use  Smoking Status Every Day   Current packs/day: 0.50   Types: Cigarettes  Smokeless Tobacco Never   Social History   Substance and Sexual Activity  Alcohol Use Yes   Comment: 2/day    Social History   Substance and Sexual Activity  Drug Use No    Family History  Problem Relation Age of Onset   Hypertension Mother    Diabetes Brother    Diabetes Maternal Aunt    Hypertension Maternal Aunt    Diabetes Maternal Uncle    Hypertension Maternal Uncle     ALLERGIES:  Patient has no known allergies.  MEDS:   Current Outpatient Medications on File Prior to Visit  Medication Sig Dispense Refill   methimazole (TAPAZOLE) 5 MG tablet SMARTSIG:3 Tablet(s) By Mouth Every Other Day PRN     metoprolol succinate (TOPROL-XL) 50 MG 24 hr tablet Take 50 mg by mouth daily.     escitalopram (LEXAPRO) 5 MG tablet Take 5 mg by mouth daily. (Patient not taking: Reported on 09/02/2022)  ferrous gluconate (FERGON) 324 MG tablet Take 324 mg by mouth 2 (two) times daily. (Patient not taking: Reported on 09/02/2022)     ketoconazole (NIZORAL) 2 % cream Apply 1 application topically daily. (Patient not taking: Reported on 09/02/2022) 60 g 2   terbinafine (LAMISIL) 250 MG tablet TAKE 1 TABLET BY MOUTH EVERY DAY (Patient not taking: Reported on 09/06/2022) 30 tablet 0   No current facility-administered medications on file prior to visit.    No orders of the defined types were placed in this encounter.    Physical examination BP (!)  147/83   Pulse 80   Ht 5\' 4"  (1.626 m)   Wt 149 lb 4.8 oz (67.7 kg)   LMP 09/15/2022 (Exact Date)   BMI 25.63 kg/m   General NAD, Conversant  HEENT Atraumatic; Op clear with mmm.  Normo-cephalic. Pupils reactive. Anicteric sclerae  Thyroid/Neck Smooth without nodularity or enlargement. Normal ROM.  Neck Supple.  Skin No rashes, lesions or ulceration. Normal palpated skin turgor. No nodularity.  Breasts: No masses or discharge.  Symmetric.  No axillary adenopathy.  Lungs: Clear to auscultation.No rales or wheezes. Normal Respiratory effort, no retractions.  Heart: NSR.  No murmurs or rubs appreciated. No peripheral edema  Abdomen: Soft.  Non-tender.  No masses.  No HSM. No hernia  Extremities: Moves all appropriately.  Normal ROM for age. No lymphadenopathy.  Neuro: Oriented to PPT.  Normal mood. Normal affect.     Pelvic:   Vulva: Normal appearance.  No lesions.  Vagina: No lesions or abnormalities noted.  Support: Normal pelvic support.  Urethra No masses tenderness or scarring.  Meatus Normal size without lesions or prolapse.  Cervix: Normal ectropion.  No lesions.  Anus: Normal exam.  No lesions.  Perineum: Normal exam.  No lesions.        Bimanual   Uterus: Top normal size-consistent with fibroids non-tender.  Mobile.  AV.  Adnexae: No masses.  Non-tender to palpation.  Cul-de-sac: Negative for abnormality.    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 Pre-op exam   2. Menorrhagia with regular cycle   3. Leiomyoma of body of uterus      Plan:   Orders: No orders of the defined types were placed in this encounter.    1.  LAVH with  bilateral salpingectomy

## 2022-10-09 NOTE — Telephone Encounter (Signed)
Called and spoke to Ms. Deitering, She had filled out the two forms for [facility] but had no other papers attached with it. She stated she didn't know how it works so I advised her to get in touch with employer and they will point her in the right direction. Patient understood.

## 2022-10-09 NOTE — Progress Notes (Signed)
PRE-OPERATIVE HISTORY AND PHYSICAL EXAM  PCP:  [facility] Subjective:   HPI:  Natalie Gregory is a 47 y.o. [phone].  Patient's last menstrual period was 09/15/2022 (exact date).  She presents today for a pre-op discussion and PE.  She has been having very heavy menstrual bleeding over the last several years. She drops her blood count with each menses. She has been receiving iron infusions for this. In the past she has tried Depo-Provera and OCPs. Most recently she has tried Micronor without success.   Review of Systems:   Constitutional: Denied constitutional symptoms, night sweats, recent illness, fatigue, fever, insomnia and weight loss.  Eyes: Denied eye symptoms, eye pain, photophobia, vision change and visual disturbance.  Ears/Nose/Throat/Neck: Denied ear, nose, throat or neck symptoms, hearing loss, nasal discharge, sinus congestion and sore throat.  Cardiovascular: Denied cardiovascular symptoms, arrhythmia, chest pain/pressure, edema, exercise intolerance, orthopnea and palpitations.  Respiratory: Denied pulmonary symptoms, asthma, pleuritic pain, productive sputum, cough, dyspnea and wheezing.  Gastrointestinal: Denied, gastro-esophageal reflux, melena, nausea and vomiting.  Genitourinary: See HPI for additional information.  Musculoskeletal: Denied musculoskeletal symptoms, stiffness, swelling, muscle weakness and myalgia.  Dermatologic: Denied dermatology symptoms, rash and scar.  Neurologic: Denied neurology symptoms, dizziness, headache, neck pain and syncope.  Psychiatric: Denied psychiatric symptoms, anxiety and depression.  Endocrine: Denied endocrine symptoms including hot flashes and night sweats.   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)    Hyperthyroidism    Trichomonal vulvovaginitis     Past Surgical History:  Procedure Laterality Date   CESAREAN SECTION        SOCIAL HISTORY:  Social History   Tobacco Use  Smoking Status Every Day   Current packs/day: 0.50   Types: Cigarettes  Smokeless Tobacco Never   Social History   Substance and Sexual Activity  Alcohol Use Yes   Comment: 2/day    Social History   Substance and Sexual Activity  Drug Use No    Family History  Problem Relation Age of Onset   Hypertension Mother    Diabetes Brother    Diabetes Maternal Aunt    Hypertension Maternal Aunt    Diabetes Maternal Uncle    Hypertension Maternal Uncle     ALLERGIES:  Patient has no known allergies.  MEDS:   Current Outpatient Medications on File Prior to Visit  Medication Sig Dispense Refill   methimazole (TAPAZOLE) 5 MG tablet SMARTSIG:3 Tablet(s) By Mouth Every Other Day PRN     metoprolol succinate (TOPROL-XL) 50 MG 24 hr tablet Take 50 mg by mouth daily.     escitalopram (LEXAPRO) 5 MG tablet Take 5 mg by mouth daily. (Patient not taking: Reported on 09/02/2022)  ferrous gluconate (FERGON) 324 MG tablet Take 324 mg by mouth 2 (two) times daily. (Patient not taking: Reported on 09/02/2022)     ketoconazole (NIZORAL) 2 % cream Apply 1 application topically daily. (Patient not taking: Reported on 09/02/2022) 60 g 2   terbinafine (LAMISIL) 250 MG tablet TAKE 1 TABLET BY MOUTH EVERY DAY (Patient not taking: Reported on 09/06/2022) 30 tablet 0   No current facility-administered medications on file prior to visit.    Meds ordered this encounter  Medications   metroNIDAZOLE (FLAGYL) 500 MG tablet    Sig: Take 1  tablet (500 mg total) by mouth 2 (two) times daily. Begin 5 days prior to scheduled surgery as directed.    Dispense:  10 tablet    Refill:  0     Physical examination BP (!) 147/83   Pulse 80   Ht 5\' 4"  (1.626 m)   Wt 149 lb 4.8 oz (67.7 kg)   LMP 09/15/2022 (Exact Date)   BMI 25.63 kg/m   General NAD, Conversant  HEENT Atraumatic; Op clear with mmm.  Normo-cephalic. Pupils reactive. Anicteric sclerae  Thyroid/Neck Smooth without nodularity or enlargement. Normal ROM.  Neck Supple.  Skin No rashes, lesions or ulceration. Normal palpated skin turgor. No nodularity.  Breasts: No masses or discharge.  Symmetric.  No axillary adenopathy.  Lungs: Clear to auscultation.No rales or wheezes. Normal Respiratory effort, no retractions.  Heart: NSR.  No murmurs or rubs appreciated. No peripheral edema  Abdomen: Soft.  Non-tender.  No masses.  No HSM. No hernia  Extremities: Moves all appropriately.  Normal ROM for age. No lymphadenopathy.  Neuro: Oriented to PPT.  Normal mood. Normal affect.     Pelvic:   Vulva: Normal appearance.  No lesions.  Vagina: No lesions or abnormalities noted.  Support: Normal pelvic support.  Urethra No masses tenderness or scarring.  Meatus Normal size without lesions or prolapse.  Cervix: Normal ectropion.  No lesions.  Anus: Normal exam.  No lesions.  Perineum: Normal exam.  No lesions.        Bimanual   Uterus: Top normal size-consistent with fibroids non-tender.  Mobile.  AV.  Adnexae: No masses.  Non-tender to palpation.  Cul-de-sac: Negative for abnormality.    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 Pre-op exam   2.  Menorrhagia with regular cycle   3. Leiomyoma of body of uterus      Plan:   Orders: Meds ordered this encounter  Medications   metroNIDAZOLE (FLAGYL) 500 MG tablet    Sig: Take 1 tablet (500 mg total) by mouth 2 (two) times daily. Begin 5 days prior to scheduled surgery as directed.    Dispense:  10 tablet    Refill:  0     1.  LAVH with bilateral salpingectomy  Pre-op discussions regarding Risks and Benefits of her scheduled surgery.  LAVH The procedure of Laparoscopic Assisted Vaginal Hysterectomy was described to the patient in detail.  We reviewed the rationale for Hysterectomy and the patient was again informed of other nonsurgical management possibilities for her condition.  She has considered these other options, and desires a Hysterectomy.  We  have reviewed the fact that Hysterectomy is permanent and that following the procedure she will not be able to become pregnant or bear children.  We have discussed the following risk factors specifically and the patient has also been informed that additional complications not mentioned may develop:  Damage to bowel, bladder, ureters or to other internal organs, bleeding, infection and the risk from anesthesia.  We have discussed the procedure itself in detail and she has an informed understanding of this surgery.  We have also discussed the recovery period in which physical and sexual activity will be restricted for a varying degree of time, often 3 - 6 weeks. The Laparoscopic Portion of Hysterectomy has also been reviewed with the patient.  She understands how the laparoscope facilitates the procedure.  We have discussed the abdominal incisions and punctures that will be used.  We have also reviewed the increased Operating Room time often accompanying LAVH.  The slightly increased risk of complications secondary to abdominal punctures, and use of laparoscopic instrumentation has also been discussed in detail.I have answered all of her questions  and I believe the patient has an informed understanding of the procedure of Laparoscopic Assisted Vaginal Hysterectomy. Oophorectomy The option of Oophorectomy has been discussed with the patient.  Detailed risk/benefits have been reviewed.  The risks discussed include, but are not limited to, hemorrhage, infection, damage to ureter or other internal organ, and Ovarian Remnant Syndrome.  The benefits include a significant decrease in the risk of Ovarian Cancer and in benign Ovarian disease.  The risk of Ovarian CA has been estimated at 1 in 70.  This is a relatively small risk.  However, should Ovarian CA develop, it is often found late in the course of the disease.  We have also discussed the role of inheritance in the development of Ovarian disease.  Some women, who have close relatives with Ovarian CA, have a higher than 1 in 70 risk of Ovarian CA.  The benefits of Estrogen replacement therapy following Oophorectomy has been stressed.  If she is premenopausal, we have discussed the fact that this procedure will make her permanently sterile and that premature menopause will result if no ERT is begun.  I have answered all of her questions, and I believe that she has an adequate and informed understanding of the risks and benefits of Oophorectomy. She has decided to keep both of her ovaries.  I spent 37 minutes involved in the care of this patient preparing to see the patient by obtaining and reviewing her medical history (including labs, imaging tests and prior procedures), documenting clinical information in the electronic health record (EHR), counseling and coordinating care plans, writing and sending prescriptions, ordering tests or procedures and in direct communicating with the patient and medical staff discussing pertinent items from her history and physical exam.   Elonda Husky, M.D. 10/09/2022 10:54 AM

## 2022-10-15 ENCOUNTER — Telehealth: Payer: Self-pay

## 2022-10-15 NOTE — Telephone Encounter (Signed)
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

## 2022-10-21 ENCOUNTER — Encounter
Admission: RE | Admit: 2022-10-21 | Discharge: 2022-10-21 | Disposition: A | Discharge status: Home / Self Care | Payer: 59 | Source: Ambulatory Visit | Attending: Obstetrics and Gynecology | Admitting: Obstetrics and Gynecology

## 2022-10-21 ENCOUNTER — Other Ambulatory Visit: Payer: Self-pay

## 2022-10-21 VITALS — Ht 64.0 in | Wt 149.0 lb

## 2022-10-21 DIAGNOSIS — I159 Secondary hypertension, unspecified: Secondary | ICD-10-CM

## 2022-10-21 DIAGNOSIS — Z01812 Encounter for preprocedural laboratory examination: Secondary | ICD-10-CM

## 2022-10-21 HISTORY — DX: Essential (primary) hypertension: I10

## 2022-10-21 HISTORY — DX: Anemia, unspecified: D64.9

## 2022-10-21 NOTE — Patient Instructions (Addendum)
Your procedure is scheduled on: Monday October 28, 2022. Report to the Registration Desk on the 1st floor of the [facility]. To find out your arrival time, please call [phone] between 1PM - 3PM on: Friday October 25, 2022. If your arrival time is 6:00 am, do not arrive before that time as the [facility] entrance doors do not open until 6:00 am.  REMEMBER: Instructions that are not followed completely may result in serious medical risk, up to and including death; or upon the discretion of your surgeon and anesthesiologist your surgery may need to be rescheduled.  Do not eat food or drink fluids after midnight the night before surgery.  No gum chewing or hard candies.   One week prior to surgery: Stop Anti-inflammatories (NSAIDS) such as Advil, Aleve, Ibuprofen, Motrin, Naproxen, Naprosyn and Aspirin based products such as Excedrin, Goody's Powder, BC Powder. Stop ANY OVER THE COUNTER supplements until after surgery. You may however, continue to take Tylenol if needed for pain up until the day of surgery.   TAKE ONLY THESE MEDICATIONS THE MORNING OF SURGERY WITH A SIP OF WATER:   metroNIDAZOLE (FLAGYL) 500 MG    No Alcohol for 24 hours before or after surgery.  No Smoking including e-cigarettes for 24 hours before surgery.  No chewable tobacco products for at least 6 hours before surgery.  No nicotine patches on the day of surgery.  Do not use any "recreational" drugs for at least a week (preferably 2 weeks) before your surgery.  Please be advised that the combination of cocaine and anesthesia may have negative outcomes, up to and including death. If you test positive for cocaine, your surgery will be cancelled.  On the morning of surgery brush your teeth with toothpaste and water, you may rinse your mouth with mouthwash if you wish. Do not swallow any toothpaste or mouthwash.  Use CHG Soap or wipes as directed on instruction sheet.  Do not wear jewelry, make-up,  hairpins, clips or nail polish.  Do not wear lotions, powders, or perfumes.   Do not shave body hair from the neck down 48 hours before surgery.  Contact lenses, hearing aids and dentures may not be worn into surgery.  Do not bring valuables to the hospital. [facility] is not responsible for any missing/lost belongings or valuables.   Bring your C-PAP to the hospital in case you may have to spend the night.   Notify your doctor if there is any change in your medical condition (cold, fever, infection).  Wear comfortable clothing (specific to your surgery type) to the hospital.  After surgery, you can help prevent lung complications by doing breathing exercises.  Take deep breaths and cough every 1-2 hours. Your doctor may order a device called an Incentive Spirometer to help you take deep breaths. When coughing or sneezing, hold a pillow firmly against your incision with both hands. This is called "splinting." Doing this helps protect your incision. It also decreases belly discomfort.  If you are being admitted to the hospital overnight, leave your suitcase in the car. After surgery it may be brought to your room.  In case of increased patient census, it may be necessary for you, the patient, to continue your postoperative care in the Same Day Surgery department.  If you are being discharged the day of surgery, you will not be allowed to drive home. You will need a responsible individual to drive you home and stay with you for 24 hours after surgery.  If you are taking public transportation, you will need to have a responsible individual with you.  Please call the Pre-admissions Testing Dept. at [phone] if you have any questions about these instructions.  Surgery Visitation Policy:  Patients having surgery or a procedure may have two visitors.  Children under the age of 57 must have an adult with them who is not the patient.  Inpatient Visitation:    Visiting hours are 7  a.m. to 8 p.m. Up to four visitors are allowed at one time in a patient room. The visitors may rotate out with other people during the day.  One visitor age 6 or older may stay with the patient overnight and must be in the room by 8 p.m.    Preparing for Surgery with CHLORHEXIDINE GLUCONATE (CHG) Soap  Chlorhexidine Gluconate (CHG) Soap  o An antiseptic cleaner that kills germs and bonds with the skin to continue killing germs even after washing  o Used for showering the night before surgery and morning of surgery  Before surgery, you can play an important role by reducing the number of germs on your skin.  CHG (Chlorhexidine gluconate) soap is an antiseptic cleanser which kills germs and bonds with the skin to continue killing germs even after washing.  Please do not use if you have an allergy to CHG or antibacterial soaps. If your skin becomes reddened/irritated stop using the CHG.  1. Shower the NIGHT BEFORE SURGERY and the MORNING OF SURGERY with CHG soap.  2. If you choose to wash your hair, wash your hair first as usual with your normal shampoo.  3. After shampooing, rinse your hair and body thoroughly to remove the shampoo.  4. Use CHG as you would any other liquid soap. You can apply CHG directly to the skin and wash gently with a scrungie or a clean washcloth.  5. Apply the CHG soap to your body only from the neck down. Do not use on open wounds or open sores. Avoid contact with your eyes, ears, mouth, and genitals (private parts). Wash face and genitals (private parts) with your normal soap.  6. Wash thoroughly, paying special attention to the area where your surgery will be performed.  7. Thoroughly rinse your body with warm water.  8. Do not shower/wash with your normal soap after using and rinsing off the CHG soap.  9. Pat yourself dry with a clean towel.  10. Wear clean pajamas to bed the night before surgery.  12. Place clean sheets on your bed the night of your  first shower and do not sleep with pets.  13. Shower again with the CHG soap on the day of surgery prior to arriving at the hospital.  14. Do not apply any deodorants/lotions/powders.  15. Please wear clean clothes to the hospital.

## 2022-10-22 ENCOUNTER — Telehealth: Payer: Self-pay | Admitting: Obstetrics and Gynecology

## 2022-10-22 NOTE — Telephone Encounter (Signed)
Patient has agreed to cancel her surgery on 10/28/2022 due to an issue with [facility] [employer] insurance is showing OON with our providers. Patient does not wish to pay out of pocket. Patient has agreed to postpone surgery until the end of September in which if issue has not been resolved she will discuss with Dr. Logan Bores about a referral to a different practice to complete the surgery.

## 2022-10-24 ENCOUNTER — Other Ambulatory Visit: Payer: 59

## 2022-10-28 ENCOUNTER — Ambulatory Visit: Admission: RE | Admit: 2022-10-28 | Payer: 59 | Source: Home / Self Care | Admitting: Obstetrics and Gynecology

## 2022-10-28 ENCOUNTER — Encounter: Admission: RE | Payer: Self-pay | Source: Home / Self Care

## 2022-10-28 SURGERY — HYSTERECTOMY, VAGINAL, LAPAROSCOPY-ASSISTED, WITH SALPINGECTOMY
Anesthesia: Choice | Laterality: Bilateral

## 2022-10-31 ENCOUNTER — Encounter: Payer: Self-pay | Admitting: Oncology

## 2022-10-31 ENCOUNTER — Telehealth: Payer: Self-pay

## 2022-10-31 ENCOUNTER — Telehealth: Payer: Self-pay | Admitting: Obstetrics and Gynecology

## 2022-10-31 NOTE — Telephone Encounter (Signed)
My chart message sent as well

## 2022-10-31 NOTE — Telephone Encounter (Signed)
Patient spoke with front desk stating "she does not have [employer]". I ran patients member ID on passport one source and informed patient that she does still have an active policy with [employer]. Pt states she was unaware she had this policy. Pt states she will contact insurance to term her policy. She only wishes to have [facility]. [facility].

## 2022-11-01 ENCOUNTER — Inpatient Hospital Stay: Payer: 59 | Admitting: Oncology

## 2022-11-01 ENCOUNTER — Inpatient Hospital Stay: Payer: 59

## 2022-11-01 ENCOUNTER — Emergency Department
Admission: EM | Admit: 2022-11-01 | Discharge: 2022-11-01 | Disposition: A | Discharge status: Home / Self Care | Payer: 59 | Attending: Emergency Medicine | Admitting: Emergency Medicine

## 2022-11-01 ENCOUNTER — Other Ambulatory Visit: Payer: Self-pay

## 2022-11-01 DIAGNOSIS — X58XXXA Exposure to other specified factors, initial encounter: Secondary | ICD-10-CM | POA: Insufficient documentation

## 2022-11-01 DIAGNOSIS — M545 Low back pain, unspecified: Secondary | ICD-10-CM | POA: Diagnosis present

## 2022-11-01 DIAGNOSIS — I10 Essential (primary) hypertension: Secondary | ICD-10-CM | POA: Diagnosis not present

## 2022-11-01 DIAGNOSIS — S39012A Strain of muscle, fascia and tendon of lower back, initial encounter: Secondary | ICD-10-CM | POA: Diagnosis not present

## 2022-11-01 MED ORDER — LIDOCAINE 5 % EX PTCH: 1.0000 | MEDICATED_PATCH | Freq: Two times a day (BID) | CUTANEOUS | 0 refills | Status: DC

## 2022-11-01 MED ORDER — KETOROLAC TROMETHAMINE 15 MG/ML IJ SOLN
15.0000 mg | Freq: Once | INTRAMUSCULAR | Status: AC
  Administered 2022-11-01: 15 mg via INTRAMUSCULAR
  Filled 2022-11-01: qty 1

## 2022-11-01 MED ORDER — ACETAMINOPHEN 500 MG PO TABS
1000.0000 mg | ORAL_TABLET | Freq: Once | ORAL | Status: AC
  Administered 2022-11-01: 1000 mg via ORAL
  Filled 2022-11-01: qty 2

## 2022-11-01 MED ORDER — IBUPROFEN 800 MG PO TABS: 800.0000 mg | ORAL_TABLET | Freq: Three times a day (TID) | ORAL | 0 refills | Status: DC | PRN

## 2022-11-01 MED ORDER — CYCLOBENZAPRINE HCL 5 MG PO TABS: 5.0000 mg | ORAL_TABLET | Freq: Three times a day (TID) | ORAL | 0 refills | Status: DC | PRN

## 2022-11-01 MED ORDER — LIDOCAINE 5 % EX PTCH
1.0000 | MEDICATED_PATCH | CUTANEOUS | Status: DC
  Administered 2022-11-01: 1 via TRANSDERMAL
  Filled 2022-11-01: qty 1

## 2022-11-01 NOTE — ED Provider Notes (Signed)
[facility] Emergency Department Provider Note     Event Date/Time   First MD Initiated Contact with Patient 11/01/22 1638     (approximate)   History   Back Pain   HPI  Natalie Gregory is a 47 y.o. female with a history of HTN presents to the ED with lower back pain x 2 days.  Denies trauma or injury.  Patient has taken nothing for treatment.  Patient reports pain is worse with bending forward and lying down.  Pain is localized to lower back with no radiation.  Denies leg weakness, fever, saddle anesthesia and loss of urinary and bladder control.    Physical Exam   Triage Vital Signs: ED Triage Vitals  Encounter Vitals Group     BP 11/01/22 1617 (!) 166/96     Systolic BP Percentile --      Diastolic BP Percentile --      Pulse Rate 11/01/22 1615 88     Resp 11/01/22 1615 18     Temp 11/01/22 1615 98.4 F (36.9 C)     Temp src --      SpO2 11/01/22 1615 100 %     Weight 11/01/22 1616 147 lb 11.3 oz (67 kg)     Height 11/01/22 1616 5\' 4"  (1.626 m)     Head Circumference --      Peak Flow --      Pain Score 11/01/22 1616 10     Pain Loc --      Pain Education --      Exclude from Growth Chart --     Most recent vital signs: Vitals:   11/01/22 1617 11/01/22 2008  BP: (!) 166/96 (!) 155/85  Pulse:  87  Resp:  18  Temp:    SpO2:  100%    General: Well appearing. Alert and oriented. INAD.  Skin:  Warm, dry and intact. No rashes or lesions noted.     Head:  NCAT.  Eyes:  PERRLA. EOMI.  Neck:   No cervical spine tenderness to palpation. Full ROM without difficulty.  CV:  Good peripheral perfusion. RRR.  RESP:  Normal effort. LCTAB.  ABD:  No distention. Soft, Non tender. No masses or organomegaly. No CVA tenderness bilaterally.  BACK:  Spinous process is midline without deformity.  Bilateral tenderness of paraspinal muscles of the lumbar region. MSK:   Full ROM in all joints. No swelling, deformity or tenderness.  NEURO: Cranial nerves  II-XII intact. No focal deficits. Sensation and motor function intact. 5/5 muscle strength of UE & LE. Gait is steady.   ED Results / Procedures / Treatments   Labs (all labs ordered are listed, but only abnormal results are displayed) Labs Reviewed - No data to display  No results found.  PROCEDURES:  Critical Care performed: No  Procedures  MEDICATIONS ORDERED IN ED: Medications  lidocaine (LIDODERM) 5 % 1 patch (1 patch Transdermal Patch Applied 11/01/22 1820)  ketorolac (TORADOL) 15 MG/ML injection 15 mg (15 mg Intramuscular Given 11/01/22 1817)  acetaminophen (TYLENOL) tablet 1,000 mg (1,000 mg Oral Given 11/01/22 1816)    IMPRESSION / MDM / ASSESSMENT AND PLAN / ED COURSE  I reviewed the triage vital signs and the nursing notes.                                 47 y.o. female presents to the emergency department  for evaluation and treatment of acute back pain without known injury. See HPI for further details.   Differential diagnosis includes, but is not limited to muscle strain, ligament injury, stenosis  Patient's presentation is most consistent with acute complicated illness / injury requiring diagnostic workup.  Patient is hemodynamically stable.  Physical exam findings are as stated above pertinent for mild tenderness of the paraspinal muscles of the lumbar region.  No imaging is indicated at this time.  Patient is given Tylenol, Toradol and lidocaine patches for pain.  Patient reports great improvement with ED pain management.  This makes my suspicion for MSK etiology and muscle strain or likely.  Given patient's status, she will be discharged home in stable condition.  He is encouraged to follow-up with primary care as needed. Patient is given ED precautions to return to the ED for any worsening or new symptoms. Patient verbalizes understanding. All questions and concerns were addressed during ED visit.     FINAL CLINICAL IMPRESSION(S) / ED DIAGNOSES   Final  diagnoses:  Strain of lumbar region, initial encounter    Rx / DC Orders   ED Discharge Orders          Ordered    cyclobenzaprine (FLEXERIL) 5 MG tablet  3 times daily PRN        11/01/22 1848    ibuprofen (ADVIL) 800 MG tablet  Every 8 hours PRN        11/01/22 1848    lidocaine (LIDODERM) 5 %  Every 12 hours        11/01/22 1937            Note:  This document was prepared using Dragon voice recognition software and may include unintentional dictation errors.    Romeo Apple, Gryffin Altice A, PA-C 11/02/22 Leonides Grills, MD 11/02/22 [phone]

## 2022-11-01 NOTE — ED Triage Notes (Signed)
Pt to ED for lower back pain started 2 days ago.

## 2022-11-01 NOTE — Discharge Instructions (Signed)
Your evaluated in the ED for lower back pain.  Your symptoms improved with ED management.  Alternate Tylenol and ibuprofen for pain with the muscle relaxer sent to your pharmacy.  Alternate with using a heating pad and ice as needed to help with pain. Follow-up with your primary care as needed.

## 2022-11-05 ENCOUNTER — Encounter: Payer: 59 | Admitting: Obstetrics and Gynecology

## 2022-11-07 ENCOUNTER — Telehealth: Payer: Self-pay | Admitting: *Deleted

## 2022-11-07 NOTE — Telephone Encounter (Signed)
I spoke to the pt and she says that her FLMA  says continuous and it should be intermittent. I read it and I also let another person and it does not say that. I called [phone]. I spoke to [place] and she looked at it and says that she has a person that I need to talk to me about this. I called the pt. And I said I am waiting for them to call me.

## 2022-11-08 ENCOUNTER — Inpatient Hospital Stay: Payer: 59 | Attending: Oncology

## 2022-11-08 ENCOUNTER — Telehealth: Payer: Self-pay | Admitting: Obstetrics and Gynecology

## 2022-11-08 ENCOUNTER — Encounter: Payer: Self-pay | Admitting: Oncology

## 2022-11-08 ENCOUNTER — Inpatient Hospital Stay: Payer: 59 | Admitting: Oncology

## 2022-11-08 ENCOUNTER — Inpatient Hospital Stay: Payer: 59

## 2022-11-08 NOTE — Telephone Encounter (Signed)
LVM for [facility] to schedule a pre op appointment with Dr. Logan Bores to set a new surgery date.

## 2022-11-18 ENCOUNTER — Encounter: Payer: Self-pay | Admitting: Oncology

## 2022-11-18 ENCOUNTER — Inpatient Hospital Stay: Payer: 59 | Admitting: Oncology

## 2022-11-18 ENCOUNTER — Inpatient Hospital Stay: Payer: 59

## 2022-11-18 ENCOUNTER — Inpatient Hospital Stay: Payer: 59 | Attending: Oncology

## 2022-11-18 DIAGNOSIS — F17210 Nicotine dependence, cigarettes, uncomplicated: Secondary | ICD-10-CM | POA: Insufficient documentation

## 2022-11-18 DIAGNOSIS — N920 Excessive and frequent menstruation with regular cycle: Secondary | ICD-10-CM | POA: Insufficient documentation

## 2022-11-18 DIAGNOSIS — D509 Iron deficiency anemia, unspecified: Secondary | ICD-10-CM | POA: Insufficient documentation

## 2022-11-19 ENCOUNTER — Ambulatory Visit (INDEPENDENT_AMBULATORY_CARE_PROVIDER_SITE_OTHER): Payer: 59 | Admitting: Obstetrics and Gynecology

## 2022-11-19 ENCOUNTER — Encounter: Payer: Self-pay | Admitting: Obstetrics and Gynecology

## 2022-11-19 ENCOUNTER — Encounter: Payer: Self-pay | Admitting: Oncology

## 2022-11-19 VITALS — BP 161/91 | HR 98 | Ht 64.0 in | Wt 150.2 lb

## 2022-11-19 DIAGNOSIS — N920 Excessive and frequent menstruation with regular cycle: Secondary | ICD-10-CM

## 2022-11-19 DIAGNOSIS — D259 Leiomyoma of uterus, unspecified: Secondary | ICD-10-CM | POA: Diagnosis not present

## 2022-11-19 DIAGNOSIS — Z01818 Encounter for other preprocedural examination: Secondary | ICD-10-CM

## 2022-11-19 NOTE — Progress Notes (Signed)
PRE-OPERATIVE HISTORY AND PHYSICAL EXAM  PCP:  [facility] Subjective:   HPI:  Natalie Gregory is a 47 y.o. [phone].  Patient's last menstrual period was 11/08/2022 (exact date).  She presents today for a pre-op discussion and PE.  She has the following symptoms: Patient has a history of heavy menstrual bleeding over several years.  This has required iron infusions as she drops her blood count with each menses.  She has failed OCPs and Depo-Provera.  Most recently she has tried Pharmacist, hospital and she continues to have bleeding.  She is requesting definitive management.  Review of Systems:   Constitutional: Denied constitutional symptoms, night sweats, recent illness, fatigue, fever, insomnia and weight loss.  Eyes: Denied eye symptoms, eye pain, photophobia, vision change and visual disturbance.  Ears/Nose/Throat/Neck: Denied ear, nose, throat or neck symptoms, hearing loss, nasal discharge, sinus congestion and sore throat.  Cardiovascular: Denied cardiovascular symptoms, arrhythmia, chest pain/pressure, edema, exercise intolerance, orthopnea and palpitations.  Respiratory: Denied pulmonary symptoms, asthma, pleuritic pain, productive sputum, cough, dyspnea and wheezing.  Gastrointestinal: Denied, gastro-esophageal reflux, melena, nausea and vomiting.  Genitourinary: See HPI for additional information.  Musculoskeletal: Denied musculoskeletal symptoms, stiffness, swelling, muscle weakness and myalgia.  Dermatologic: Denied dermatology symptoms, rash and scar.  Neurologic: Denied neurology symptoms, dizziness, headache, neck pain and syncope.  Psychiatric: Denied psychiatric symptoms, anxiety and depression.  Endocrine: Denied endocrine symptoms including hot flashes and night sweats.   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)    Hypertension    Hyperthyroidism    Trichomonal vulvovaginitis     Past Surgical History:  Procedure Laterality Date   CESAREAN SECTION     x 3      SOCIAL HISTORY:  Social History   Tobacco Use  Smoking Status Every Day   Current packs/day: 0.50   Types: Cigarettes  Smokeless Tobacco Never   Social History   Substance and Sexual Activity  Alcohol Use Yes   Alcohol/week: 2.0 - 3.0 standard drinks of alcohol   Types: 2 - 3 Cans of beer per week   Comment: 2-3/day    Social History   Substance and Sexual Activity  Drug Use No    Family History  Problem Relation Age of Onset   Hypertension Mother    Diabetes Brother    Diabetes Maternal Aunt    Hypertension Maternal Aunt    Diabetes Maternal Uncle    Hypertension Maternal Uncle     ALLERGIES:  Patient has no known allergies.  MEDS:   Current Outpatient Medications on File Prior to Visit  Medication Sig Dispense Refill   cyclobenzaprine (FLEXERIL) 5 MG tablet Take 1 tablet (5 mg  total) by mouth 3 (three) times daily as needed. 30 tablet 0   ibuprofen (ADVIL) 800 MG tablet Take 1 tablet (800 mg total) by mouth every 8 (eight) hours as needed. 30 tablet 0   lidocaine (LIDODERM) 5 % Place 1 patch onto the skin every 12 (twelve) hours. Remove & Discard patch within 12 hours or as directed by MD 10 patch 0   methimazole (TAPAZOLE) 5 MG tablet SMARTSIG:3 Tablet(s) By Mouth Every Other Day PRN     metoprolol succinate (TOPROL-XL) 50 MG 24 hr tablet Take 50 mg by mouth daily.     No current facility-administered medications on file prior to visit.    No orders of  the defined types were placed in this encounter.    Physical examination BP (!) 161/91   Pulse 98   Ht 5\' 4"  (1.626 m)   Wt 150 lb 3.2 oz (68.1 kg)   LMP 11/08/2022 (Exact Date)   BMI 25.78 kg/m   General NAD, Conversant  HEENT Atraumatic; Op clear with mmm.  Normo-cephalic.  Anicteric sclerae  Thyroid/Neck Smooth without nodularity or enlargement. Normal ROM.  Neck Supple.  Skin No rashes, lesions or ulceration. Normal palpated skin turgor. No nodularity.  Breasts: No masses or discharge.  Symmetric.  No axillary adenopathy.  Lungs: Clear to auscultation.No rales or wheezes. Normal Respiratory effort, no retractions.  Heart: NSR.  No murmurs or rubs appreciated. No peripheral edema  Abdomen: Soft.  Non-tender.  No masses.  No HSM. No hernia  Extremities: Moves all appropriately.  Normal ROM for age. No lymphadenopathy.  Neuro: Oriented to PPT.  Normal mood. Normal affect.     Pelvic:   Vulva: Normal appearance.  No lesions.  Vagina: No lesions or abnormalities noted.  Support: Normal pelvic support.  Urethra No masses tenderness or scarring.  Meatus Normal size without lesions or prolapse.  Cervix: Normal ectropion.  No lesions.  Anus: Normal exam.  No lesions.  Perineum: Normal exam.  No lesions.        Bimanual   Uterus: Enlarged consistent with uterine fibroids.  Adnexae: No masses.  Non-tender to palpation.  Cul-de-sac: Negative for abnormality.    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 Pre-op exam   2. Menorrhagia with regular cycle   3. Leiomyoma of body of uterus      Plan:   Orders: No orders of the defined types were placed  in this encounter.    1.  LAVH with bilateral salpingectomy  Pre-op discussions regarding Risks and Benefits of her scheduled surgery.  LAVH The procedure of Laparoscopic Assisted Vaginal Hysterectomy was described to the patient in detail.  We reviewed the rationale for Hysterectomy and the patient was again informed of other nonsurgical management possibilities for her condition.  She has considered these other options, and desires a Hysterectomy.  We have reviewed the fact that Hysterectomy is permanent and that following the procedure she will not be able to become pregnant or bear children.  We have discussed the following risk factors specifically and the patient has also been informed that additional complications not mentioned may develop:  Damage to bowel, bladder, ureters  or to other internal organs, bleeding, infection and the risk from anesthesia.  We have discussed the procedure itself in detail and she has an informed understanding of this surgery.  We have also discussed the recovery period in which physical and sexual activity will be restricted for a varying degree of time, often 3 - 6 weeks. The Laparoscopic Portion of Hysterectomy has also been reviewed with the patient.  She understands how the laparoscope facilitates the procedure.  We have discussed the abdominal incisions and punctures that will be used.  We have also reviewed the increased Operating Room time often accompanying LAVH.  The slightly increased risk of complications secondary to abdominal punctures, and use of laparoscopic instrumentation has also been discussed in detail.I have answered all of her questions and I believe the patient has an informed understanding of the procedure of Laparoscopic Assisted Vaginal Hysterectomy.  I spent 33 minutes involved in the care of this patient preparing to see the patient by obtaining and reviewing her medical history (including labs, imaging tests and prior procedures),  documenting clinical information in the electronic health record (EHR), counseling and coordinating care plans, writing and sending prescriptions, ordering tests or procedures and in direct communicating with the patient and medical staff discussing pertinent items from her history and physical exam.  Elonda Husky, M.D. 11/19/2022 8:45 AM

## 2022-11-19 NOTE — H&P (Signed)
PRE-OPERATIVE HISTORY AND PHYSICAL EXAM  PCP:  [facility] Subjective:   HPI:  Natalie Gregory is a 47 y.o. [phone].  Patient's last menstrual period was 11/08/2022 (exact date).  She presents today for a pre-op discussion and PE.  She has the following symptoms: Patient has a history of heavy menstrual bleeding over several years.  This has required iron infusions as she drops her blood count with each menses.  She has failed OCPs and Depo-Provera.  Most recently she has tried Pharmacist, hospital and she continues to have bleeding.  She is requesting definitive management.  Review of Systems:   Constitutional: Denied constitutional symptoms, night sweats, recent illness, fatigue, fever, insomnia and weight loss.  Eyes: Denied eye symptoms, eye pain, photophobia, vision change and visual disturbance.  Ears/Nose/Throat/Neck: Denied ear, nose, throat or neck symptoms, hearing loss, nasal discharge, sinus congestion and sore throat.  Cardiovascular: Denied cardiovascular symptoms, arrhythmia, chest pain/pressure, edema, exercise intolerance, orthopnea and palpitations.  Respiratory: Denied pulmonary symptoms, asthma, pleuritic pain, productive sputum, cough, dyspnea and wheezing.  Gastrointestinal: Denied, gastro-esophageal reflux, melena, nausea and vomiting.  Genitourinary: See HPI for additional information.  Musculoskeletal: Denied musculoskeletal symptoms, stiffness, swelling, muscle weakness and myalgia.  Dermatologic: Denied dermatology symptoms, rash and scar.  Neurologic: Denied neurology symptoms, dizziness, headache, neck pain and syncope.  Psychiatric: Denied psychiatric symptoms, anxiety and depression.  Endocrine: Denied endocrine symptoms including hot flashes and night sweats.   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)    Hypertension    Hyperthyroidism    Trichomonal vulvovaginitis     Past Surgical History:  Procedure Laterality Date   CESAREAN SECTION     x 3      SOCIAL HISTORY:  Social History   Tobacco Use  Smoking Status Every Day   Current packs/day: 0.50   Types: Cigarettes  Smokeless Tobacco Never   Social History   Substance and Sexual Activity  Alcohol Use Yes   Alcohol/week: 2.0 - 3.0 standard drinks of alcohol   Types: 2 - 3 Cans of beer per week   Comment: 2-3/day    Social History   Substance and Sexual Activity  Drug Use No    Family History  Problem Relation Age of Onset   Hypertension Mother    Diabetes Brother    Diabetes Maternal Aunt    Hypertension Maternal Aunt    Diabetes Maternal Uncle    Hypertension Maternal Uncle     ALLERGIES:  Patient has no known allergies.  MEDS:   Current Outpatient Medications on File Prior to Visit  Medication Sig Dispense Refill   cyclobenzaprine (FLEXERIL) 5 MG tablet Take 1 tablet (5 mg  total) by mouth 3 (three) times daily as needed. 30 tablet 0   ibuprofen (ADVIL) 800 MG tablet Take 1 tablet (800 mg total) by mouth every 8 (eight) hours as needed. 30 tablet 0   lidocaine (LIDODERM) 5 % Place 1 patch onto the skin every 12 (twelve) hours. Remove & Discard patch within 12 hours or as directed by MD 10 patch 0   methimazole (TAPAZOLE) 5 MG tablet SMARTSIG:3 Tablet(s) By Mouth Every Other Day PRN     metoprolol succinate (TOPROL-XL) 50 MG 24 hr tablet Take 50 mg by mouth daily.     No current facility-administered medications on file prior to visit.    No orders of  the defined types were placed in this encounter.    Physical examination BP (!) 161/91   Pulse 98   Ht 5\' 4"  (1.626 m)   Wt 150 lb 3.2 oz (68.1 kg)   LMP 11/08/2022 (Exact Date)   BMI 25.78 kg/m   General NAD, Conversant  HEENT Atraumatic; Op clear with mmm.  Normo-cephalic.  Anicteric sclerae  Thyroid/Neck Smooth without nodularity or enlargement. Normal ROM.  Neck Supple.  Skin No rashes, lesions or ulceration. Normal palpated skin turgor. No nodularity.  Breasts: No masses or discharge.  Symmetric.  No axillary adenopathy.  Lungs: Clear to auscultation.No rales or wheezes. Normal Respiratory effort, no retractions.  Heart: NSR.  No murmurs or rubs appreciated. No peripheral edema  Abdomen: Soft.  Non-tender.  No masses.  No HSM. No hernia  Extremities: Moves all appropriately.  Normal ROM for age. No lymphadenopathy.  Neuro: Oriented to PPT.  Normal mood. Normal affect.     Pelvic:   Vulva: Normal appearance.  No lesions.  Vagina: No lesions or abnormalities noted.  Support: Normal pelvic support.  Urethra No masses tenderness or scarring.  Meatus Normal size without lesions or prolapse.  Cervix: Normal ectropion.  No lesions.  Anus: Normal exam.  No lesions.  Perineum: Normal exam.  No lesions.        Bimanual   Uterus: Enlarged consistent with uterine fibroids.  Adnexae: No masses.  Non-tender to palpation.  Cul-de-sac: Negative for abnormality.    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 Pre-op exam   2. Menorrhagia with regular cycle   3. Leiomyoma of body of uterus      Plan:   Orders: No orders of the defined types were placed  in this encounter.    1.  LAVH with bilateral salpingectomy

## 2022-11-19 NOTE — H&P (View-Only) (Signed)
PRE-OPERATIVE HISTORY AND PHYSICAL EXAM  PCP:  [facility] Subjective:   HPI:  Natalie Gregory is a 47 y.o. [phone].  Patient's last menstrual period was 11/08/2022 (exact date).  She presents today for a pre-op discussion and PE.  She has the following symptoms: Patient has a history of heavy menstrual bleeding over several years.  This has required iron infusions as she drops her blood count with each menses.  She has failed OCPs and Depo-Provera.  Most recently she has tried Pharmacist, hospital and she continues to have bleeding.  She is requesting definitive management.  Review of Systems:   Constitutional: Denied constitutional symptoms, night sweats, recent illness, fatigue, fever, insomnia and weight loss.  Eyes: Denied eye symptoms, eye pain, photophobia, vision change and visual disturbance.  Ears/Nose/Throat/Neck: Denied ear, nose, throat or neck symptoms, hearing loss, nasal discharge, sinus congestion and sore throat.  Cardiovascular: Denied cardiovascular symptoms, arrhythmia, chest pain/pressure, edema, exercise intolerance, orthopnea and palpitations.  Respiratory: Denied pulmonary symptoms, asthma, pleuritic pain, productive sputum, cough, dyspnea and wheezing.  Gastrointestinal: Denied, gastro-esophageal reflux, melena, nausea and vomiting.  Genitourinary: See HPI for additional information.  Musculoskeletal: Denied musculoskeletal symptoms, stiffness, swelling, muscle weakness and myalgia.  Dermatologic: Denied dermatology symptoms, rash and scar.  Neurologic: Denied neurology symptoms, dizziness, headache, neck pain and syncope.  Psychiatric: Denied psychiatric symptoms, anxiety and depression.  Endocrine: Denied endocrine symptoms including hot flashes and night sweats.   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)    Hypertension    Hyperthyroidism    Trichomonal vulvovaginitis     Past Surgical History:  Procedure Laterality Date   CESAREAN SECTION     x 3      SOCIAL HISTORY:  Social History   Tobacco Use  Smoking Status Every Day   Current packs/day: 0.50   Types: Cigarettes  Smokeless Tobacco Never   Social History   Substance and Sexual Activity  Alcohol Use Yes   Alcohol/week: 2.0 - 3.0 standard drinks of alcohol   Types: 2 - 3 Cans of beer per week   Comment: 2-3/day    Social History   Substance and Sexual Activity  Drug Use No    Family History  Problem Relation Age of Onset   Hypertension Mother    Diabetes Brother    Diabetes Maternal Aunt    Hypertension Maternal Aunt    Diabetes Maternal Uncle    Hypertension Maternal Uncle     ALLERGIES:  Patient has no known allergies.  MEDS:   Current Outpatient Medications on File Prior to Visit  Medication Sig Dispense Refill   cyclobenzaprine (FLEXERIL) 5 MG tablet Take 1 tablet (5 mg  total) by mouth 3 (three) times daily as needed. 30 tablet 0   ibuprofen (ADVIL) 800 MG tablet Take 1 tablet (800 mg total) by mouth every 8 (eight) hours as needed. 30 tablet 0   lidocaine (LIDODERM) 5 % Place 1 patch onto the skin every 12 (twelve) hours. Remove & Discard patch within 12 hours or as directed by MD 10 patch 0   methimazole (TAPAZOLE) 5 MG tablet SMARTSIG:3 Tablet(s) By Mouth Every Other Day PRN     metoprolol succinate (TOPROL-XL) 50 MG 24 hr tablet Take 50 mg by mouth daily.     No current facility-administered medications on file prior to visit.    No orders of  the defined types were placed in this encounter.    Physical examination BP (!) 161/91   Pulse 98   Ht 5\' 4"  (1.626 m)   Wt 150 lb 3.2 oz (68.1 kg)   LMP 11/08/2022 (Exact Date)   BMI 25.78 kg/m   General NAD, Conversant  HEENT Atraumatic; Op clear with mmm.  Normo-cephalic.  Anicteric sclerae  Thyroid/Neck Smooth without nodularity or enlargement. Normal ROM.  Neck Supple.  Skin No rashes, lesions or ulceration. Normal palpated skin turgor. No nodularity.  Breasts: No masses or discharge.  Symmetric.  No axillary adenopathy.  Lungs: Clear to auscultation.No rales or wheezes. Normal Respiratory effort, no retractions.  Heart: NSR.  No murmurs or rubs appreciated. No peripheral edema  Abdomen: Soft.  Non-tender.  No masses.  No HSM. No hernia  Extremities: Moves all appropriately.  Normal ROM for age. No lymphadenopathy.  Neuro: Oriented to PPT.  Normal mood. Normal affect.     Pelvic:   Vulva: Normal appearance.  No lesions.  Vagina: No lesions or abnormalities noted.  Support: Normal pelvic support.  Urethra No masses tenderness or scarring.  Meatus Normal size without lesions or prolapse.  Cervix: Normal ectropion.  No lesions.  Anus: Normal exam.  No lesions.  Perineum: Normal exam.  No lesions.        Bimanual   Uterus: Enlarged consistent with uterine fibroids.  Adnexae: No masses.  Non-tender to palpation.  Cul-de-sac: Negative for abnormality.    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 Pre-op exam   2. Menorrhagia with regular cycle   3. Leiomyoma of body of uterus      Plan:   Orders: No orders of the defined types were placed  in this encounter.    1.  LAVH with bilateral salpingectomy

## 2022-11-19 NOTE — Progress Notes (Signed)
Patient presents today for a pre-op exam prior to LAVH. She states no additional concerns today.

## 2022-11-20 ENCOUNTER — Telehealth: Payer: Self-pay | Admitting: *Deleted

## 2022-11-20 NOTE — Telephone Encounter (Addendum)
Jeaninne called saying that there is still a problem with FMLA saying that it is not got fixed. I called again and the person with her file is not there. The person on the phone said that she looked at the papers and it does say no to continuous for this pt. But the person that her her case says I put continous. She is to call me to get it straight. I also told pt about hte above and that I am going on vacation and I have already called before and they say they will call back and no one does. Pt. Feels the same way

## 2022-11-22 ENCOUNTER — Encounter: Payer: Self-pay | Admitting: Oncology

## 2022-11-25 ENCOUNTER — Telehealth: Payer: Self-pay | Admitting: Oncology

## 2022-11-25 ENCOUNTER — Telehealth: Payer: Self-pay

## 2022-11-25 NOTE — Telephone Encounter (Signed)
Patient called asking about future apts not being scheduled and she received a letter asking her to select a new provider. She stated [facility] and was getting no answer when having issues with transportation but Dr. Smith Robert is the only hematologist that communicates with her PCP and is asking if she can reschedule. Per manager its policy and its per MD discretion. Per Dr. Smith Robert ok to give patient one more chance. Scheduler will call and inform patient

## 2022-11-25 NOTE — Telephone Encounter (Signed)
Called patient and scheduled an appointment with her to see Dr. Smith Robert. She verbalized that Monday October 21st at 2:15 would work and she would be here.

## 2022-12-02 ENCOUNTER — Encounter: Payer: Self-pay | Admitting: Oncology

## 2022-12-02 ENCOUNTER — Inpatient Hospital Stay (HOSPITAL_BASED_OUTPATIENT_CLINIC_OR_DEPARTMENT_OTHER): Payer: 59 | Admitting: Oncology

## 2022-12-02 ENCOUNTER — Inpatient Hospital Stay: Payer: 59

## 2022-12-02 VITALS — BP 150/93 | HR 94 | Temp 97.4°F | Wt 145.0 lb

## 2022-12-02 DIAGNOSIS — F17210 Nicotine dependence, cigarettes, uncomplicated: Secondary | ICD-10-CM | POA: Diagnosis not present

## 2022-12-02 DIAGNOSIS — D649 Anemia, unspecified: Secondary | ICD-10-CM | POA: Diagnosis not present

## 2022-12-02 DIAGNOSIS — N920 Excessive and frequent menstruation with regular cycle: Secondary | ICD-10-CM | POA: Diagnosis not present

## 2022-12-02 DIAGNOSIS — D508 Other iron deficiency anemias: Secondary | ICD-10-CM

## 2022-12-02 DIAGNOSIS — D509 Iron deficiency anemia, unspecified: Secondary | ICD-10-CM | POA: Diagnosis not present

## 2022-12-02 LAB — CBC
HCT: 36.2 % (ref 36.0–46.0)
Hemoglobin: 11.4 g/dL — ABNORMAL LOW (ref 12.0–15.0)
MCH: 26.8 pg (ref 26.0–34.0)
MCHC: 31.5 g/dL (ref 30.0–36.0)
MCV: 85.2 fL (ref 80.0–100.0)
Platelets: 299 10*3/uL (ref 150–400)
RBC: 4.25 MIL/uL (ref 3.87–5.11)
RDW: 19.2 % — ABNORMAL HIGH (ref 11.5–15.5)
WBC: 7.3 10*3/uL (ref 4.0–10.5)
nRBC: 0 % (ref 0.0–0.2)

## 2022-12-02 LAB — FERRITIN: Ferritin: 6 ng/mL — ABNORMAL LOW (ref 11–307)

## 2022-12-02 LAB — IRON AND TIBC
Iron: 28 ug/dL (ref 28–170)
Saturation Ratios: 6 % — ABNORMAL LOW (ref 10.4–31.8)
TIBC: 476 ug/dL — ABNORMAL HIGH (ref 250–450)
UIBC: 448 ug/dL

## 2022-12-02 NOTE — Progress Notes (Signed)
Hematology/Oncology Consult note [facility]  [phone] [phone]  Patient Care Team: [facility], [facility] as PCP - General (General Practice) Creig Hines, MD as Consulting Physician (Oncology)   Name of the patient: Natalie Gregory  [id number]  1975-08-27   Date of visit: 12/02/22  Diagnosis-iron deficiency anemia  Chief complaint/ Reason for visit-routine follow-up of iron deficiency anemia  Heme/Onc history: patient is a 47 year old African-American female with past medical history significant for menorrhagia for which she is undergoing workup with GYN.  She had a endometrial biopsy recently which was nondiagnostic.  She has tried various other measures including birth control for menorrhagia but it has not helped.  Patient is hoping to undergo hysterectomy soon but has been referred to [place] for her microcytic anemia.Most recent CBC the from about a week ago showed H&H of 6.8/24.4 with an MCV of 72.  White count and platelets were normal.  Repeat CBC today shows similar hemoglobin.  Iron studies from today are also indicated of iron deficiency anemia with a ferritin level of 3 and an iron saturation of 4%.  Patient reports feeling fatigued.  Her menstrual cycles continue to be heavy.  Denies any blood loss in her stool or urine.  Denies any dark melanotic stools.  Denies any family history of colon cancer.    Interval history-patient feels better after receiving IV iron and has not craves for ice recently.  She is scheduled to undergo hysterectomy in early November 2024.  ECOG PS- 0 Pain scale- 0  Review of systems- Review of Systems  Constitutional:  Negative for chills, fever, malaise/fatigue and weight loss.  HENT:  Negative for congestion, ear discharge and nosebleeds.   Eyes:  Negative for blurred vision.  Respiratory:  Negative for cough, hemoptysis, sputum production, shortness of breath and wheezing.    Cardiovascular:  Negative for chest pain, palpitations, orthopnea and claudication.  Gastrointestinal:  Negative for abdominal pain, blood in stool, constipation, diarrhea, heartburn, melena, nausea and vomiting.  Genitourinary:  Negative for dysuria, flank pain, frequency, hematuria and urgency.  Musculoskeletal:  Negative for back pain, joint pain and myalgias.  Skin:  Negative for rash.  Neurological:  Negative for dizziness, tingling, focal weakness, seizures, weakness and headaches.  Endo/Heme/Allergies:  Does not bruise/bleed easily.  Psychiatric/Behavioral:  Negative for depression and suicidal ideas. The patient does not have insomnia.       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)    Hypertension    Hyperthyroidism    Trichomonal vulvovaginitis      Past Surgical History:  Procedure Laterality Date   CESAREAN SECTION     x 3    Social History   Socioeconomic History   Marital status: Single    Spouse name: Not on file   Number of children: Not on file   Years of education: Not on file   Highest education level: Not on file  Occupational History   Not on file  Tobacco Use   Smoking status: Every Day    Current packs/day: 0.50    Types: Cigarettes   Smokeless tobacco: Never  Vaping Use   Vaping status: Never Used  Substance and  Sexual Activity   Alcohol use: Yes    Alcohol/week: 2.0 - 3.0 standard drinks of alcohol    Types: 2 - 3 Cans of beer per week    Comment: 2-3/day   Drug use: No   Sexual activity: Yes    Birth control/protection: Surgical    Comment: BTL  Other Topics Concern   Not on file  Social History Narrative   Not on file   Social Determinants of Health    Financial Resource Strain: Medium Risk (09/03/2019)   Received from [facility], [facility]   Overall Financial Resource Strain (CARDIA)    Difficulty of Paying Living Expenses: Somewhat hard  Food Insecurity: No Food Insecurity (09/09/2022)   [facility] from [facility]   Hunger Vital Sign    Worried About Running Out of Food in the Last Year: Never true    Ran Out of Food in the Last Year: Never true  Transportation Needs: No Transportation Needs (09/09/2022)   Received from [facility] - Transportation    In the past 12 months, has lack of transportation kept you from medical appointments or from getting medications?: No    Lack of Transportation (Non-Medical): No  Physical Activity: Inactive (09/03/2019)   Received from [facility], [facility]   Exercise Vital Sign    Days of Exercise per Week: 0 days    Minutes of Exercise per Session: 0 min  Stress: No Stress Concern Present (09/03/2019)   Received from [facility], [facility]   [employer] of Occupational Health - Occupational Stress Questionnaire    Feeling of Stress : Only a little  Social Connections: Moderately Isolated (09/03/2019)   Received from [facility], [facility]   Social Connection and Isolation Panel [NHANES]    Frequency of Communication with Friends and Family: More than three times a week    Frequency of Social Gatherings with Friends and Family: Three times a week    Attends Religious Services: More than 4 times per year    Active Member of Clubs or Organizations: No    Attends Banker Meetings: Never    Marital Status: Never married  Catering manager Violence: Not on file    Family History  Problem Relation Age of Onset   Hypertension Mother    Diabetes Brother    Diabetes Maternal Aunt     Hypertension Maternal Aunt    Diabetes Maternal Uncle    Hypertension Maternal Uncle      Current Outpatient Medications:    methimazole (TAPAZOLE) 5 MG tablet, Take 10 mg by mouth daily., Disp: , Rfl:    metoprolol succinate (TOPROL-XL) 50 MG 24 hr tablet, Take 50 mg by mouth daily., Disp: , Rfl:   Physical exam:  Vitals:   12/02/22 1408  BP: (!) 150/93  Pulse: 94  Temp: (!) 97.4 F (36.3 C)  TempSrc: Tympanic  SpO2: 100%  Weight: 145 lb (65.8 kg)   Physical Exam Cardiovascular:     Rate and Rhythm: Normal rate and regular rhythm.     Heart sounds: Normal heart sounds.  Pulmonary:     Effort: Pulmonary effort is normal.  Skin:    General: Skin is warm and dry.  Neurological:     Mental Status: She is alert and oriented to person, place, and time.  Latest Ref Rng & Units 08/17/2019    1:59 PM  CMP  Glucose 70 - 99 mg/dL 94   BUN 6 - 20 mg/dL 9   Creatinine 4.03 - 4.74 mg/dL 2.59   Sodium 563 - 875 mmol/L 138   Potassium 3.5 - 5.1 mmol/L 4.2   Chloride 98 - 111 mmol/L 103   CO2 22 - 32 mmol/L 27   Calcium 8.9 - 10.3 mg/dL 8.7   Total Protein 6.5 - 8.1 g/dL 7.5   Total Bilirubin 0.3 - 1.2 mg/dL 0.7   Alkaline Phos 38 - 126 U/L 68   AST 15 - 41 U/L 40   ALT 0 - 44 U/L 33       Latest Ref Rng & Units 12/02/2022    2:58 PM  CBC  WBC 4.0 - 10.5 K/uL 7.3   Hemoglobin 12.0 - 15.0 g/dL 64.3   Hematocrit 32.9 - 46.0 % 36.2   Platelets 150 - 400 K/uL 299    Assessment and plan- Patient is a 47 y.o. female here for routine follow-up of iron deficiency anemia  Patient's hemoglobin has improved significantly from 9.7 in August to 11.4 presently with IV iron.  Ferritin levels are normal at 48 although iron saturation is still low at 10%.  Given that she has an upcoming hysterectomy in early November we will plan to repeat 3 more doses of Venofer at this time.  Repeat CBC ferritin and iron studies in 3 and 6 months and I will see her back in 6 months   Visit  Diagnosis 1. Other iron deficiency anemia   2. Anemia, unspecified type      Dr. Owens Shark, MD, MPH [facility] [phone] 12/02/2022 4:24 PM

## 2022-12-09 ENCOUNTER — Encounter
Admission: RE | Admit: 2022-12-09 | Discharge: 2022-12-09 | Disposition: A | Discharge status: Home / Self Care | Payer: 59 | Source: Ambulatory Visit | Attending: Obstetrics and Gynecology | Admitting: Obstetrics and Gynecology

## 2022-12-09 DIAGNOSIS — Z01810 Encounter for preprocedural cardiovascular examination: Secondary | ICD-10-CM

## 2022-12-09 DIAGNOSIS — I159 Secondary hypertension, unspecified: Secondary | ICD-10-CM

## 2022-12-09 DIAGNOSIS — Z01818 Encounter for other preprocedural examination: Secondary | ICD-10-CM

## 2022-12-09 HISTORY — DX: Tobacco use: Z72.0

## 2022-12-09 HISTORY — DX: Late syphilis, latent: A52.8

## 2022-12-09 HISTORY — DX: Other specified abnormal uterine and vaginal bleeding: N93.8

## 2022-12-09 HISTORY — DX: Leiomyoma of uterus, unspecified: D25.9

## 2022-12-09 NOTE — Patient Instructions (Signed)
Your procedure is scheduled on:12-16-22 Monday Report to the Registration Desk on the 1st floor of the [facility].Then proceed to the 2nd floor Surgery Desk To find out your arrival time, please call [phone] between 1PM - 3PM on:12-13-22 Friday If your arrival time is 6:00 am, do not arrive before that time as the [facility] entrance doors do not open until 6:00 am.  REMEMBER: Instructions that are not followed completely may result in serious medical risk, up to and including death; or upon the discretion of your surgeon and anesthesiologist your surgery may need to be rescheduled.  Do not eat food OR drink any liquids after midnight the night before surgery.  No gum chewing or hard candies.  One week prior to surgery:Last dose 12-09-22 Stop Anti-inflammatories (NSAIDS) such as Advil, Aleve, Ibuprofen, Motrin, Naproxen, Naprosyn and Aspirin based products such as Excedrin, Goody's Powder, BC Powder. Stop ANY OVER THE COUNTER supplements until after surgery.  You may however, continue to take Tylenol if needed for pain up until the day of surgery.  Continue taking all of your other prescription medications up until the day of surgery.  ON THE DAY OF SURGERY ONLY TAKE THESE MEDICATIONS WITH SIPS OF WATER: -methimazole (TAPAZOLE)  -metoprolol succinate (TOPROL-XL)   No Alcohol for 24 hours before or after surgery.  No Smoking including e-cigarettes for 24 hours before surgery.  No chewable tobacco products for at least 6 hours before surgery.  No nicotine patches on the day of surgery.  Do not use any "recreational" drugs for at least a week (preferably 2 weeks) before your surgery.  Please be advised that the combination of cocaine and anesthesia may have negative outcomes, up to and including death. If you test positive for cocaine, your surgery will be cancelled.  On the morning of surgery brush your teeth with toothpaste and water, you may rinse your mouth with mouthwash  if you wish. Do not swallow any toothpaste or mouthwash.  Use CHG Soap as directed on instruction sheet.  Do not wear jewelry, make-up, hairpins, clips or nail polish.  For welded (permanent) jewelry: bracelets, anklets, waist bands, etc.  Please have this removed prior to surgery.  If it is not removed, there is a chance that hospital personnel will need to cut it off on the day of surgery.  Do not wear lotions, powders, or perfumes.   Do not shave body hair from the neck down 48 hours before surgery.  Contact lenses, hearing aids and dentures may not be worn into surgery.  Do not bring valuables to the hospital. [facility] is not responsible for any missing/lost belongings or valuables.   Notify your doctor if there is any change in your medical condition (cold, fever, infection).  Wear comfortable clothing (specific to your surgery type) to the hospital.  After surgery, you can help prevent lung complications by doing breathing exercises.  Take deep breaths and cough every 1-2 hours. Your doctor may order a device called an Incentive Spirometer to help you take deep breaths. When coughing or sneezing, hold a pillow firmly against your incision with both hands. This is called "splinting." Doing this helps protect your incision. It also decreases belly discomfort.  If you are being admitted to the hospital overnight, leave your suitcase in the car. After surgery it may be brought to your room.  In case of increased patient census, it may be necessary for you, the patient, to continue your postoperative care in the Same Day  Surgery department.  If you are being discharged the day of surgery, you will not be allowed to drive home. You will need a responsible individual to drive you home and stay with you for 24 hours after surgery.   If you are taking public transportation, you will need to have a responsible individual with you.  Please call the Pre-admissions Testing Dept. at  [phone] if you have any questions about these instructions.  Surgery Visitation Policy:  Patients having surgery or a procedure may have two visitors.  Children under the age of 26 must have an adult with them who is not the patient.     Preparing for Surgery with CHLORHEXIDINE GLUCONATE (CHG) Soap  Chlorhexidine Gluconate (CHG) Soap  o An antiseptic cleaner that kills germs and bonds with the skin to continue killing germs even after washing  o Used for showering the night before surgery and morning of surgery  Before surgery, you can play an important role by reducing the number of germs on your skin.  CHG (Chlorhexidine gluconate) soap is an antiseptic cleanser which kills germs and bonds with the skin to continue killing germs even after washing.  Please do not use if you have an allergy to CHG or antibacterial soaps. If your skin becomes reddened/irritated stop using the CHG.  1. Shower the NIGHT BEFORE SURGERY and the MORNING OF SURGERY with CHG soap.  2. If you choose to wash your hair, wash your hair first as usual with your normal shampoo.  3. After shampooing, rinse your hair and body thoroughly to remove the shampoo.  4. Use CHG as you would any other liquid soap. You can apply CHG directly to the skin and wash gently with a scrungie or a clean washcloth.  5. Apply the CHG soap to your body only from the neck down. Do not use on open wounds or open sores. Avoid contact with your eyes, ears, mouth, and genitals (private parts). Wash face and genitals (private parts) with your normal soap.  6. Wash thoroughly, paying special attention to the area where your surgery will be performed.  7. Thoroughly rinse your body with warm water.  8. Do not shower/wash with your normal soap after using and rinsing off the CHG soap.  9. Pat yourself dry with a clean towel.  10. Wear clean pajamas to bed the night before surgery.  12. Place clean sheets on your bed the night  of your first shower and do not sleep with pets.  13. Shower again with the CHG soap on the day of surgery prior to arriving at the hospital.  14. Do not apply any deodorants/lotions/powders.  15. Please wear clean clothes to the hospital.

## 2022-12-10 ENCOUNTER — Inpatient Hospital Stay: Payer: 59

## 2022-12-10 VITALS — BP 154/88 | HR 82 | Temp 98.0°F | Resp 16

## 2022-12-10 DIAGNOSIS — F17210 Nicotine dependence, cigarettes, uncomplicated: Secondary | ICD-10-CM | POA: Diagnosis not present

## 2022-12-10 DIAGNOSIS — D508 Other iron deficiency anemias: Secondary | ICD-10-CM

## 2022-12-10 DIAGNOSIS — N920 Excessive and frequent menstruation with regular cycle: Secondary | ICD-10-CM | POA: Diagnosis not present

## 2022-12-10 DIAGNOSIS — D509 Iron deficiency anemia, unspecified: Secondary | ICD-10-CM | POA: Diagnosis not present

## 2022-12-10 MED ORDER — SODIUM CHLORIDE 0.9% FLUSH
10.0000 mL | Freq: Once | INTRAVENOUS | Status: AC | PRN
  Administered 2022-12-10: 10 mL
  Filled 2022-12-10: qty 10

## 2022-12-10 MED ORDER — IRON SUCROSE 20 MG/ML IV SOLN
200.0000 mg | INTRAVENOUS | Status: DC
Start: 2022-12-10 — End: 2022-12-10
  Administered 2022-12-10: 200 mg via INTRAVENOUS

## 2022-12-10 NOTE — Progress Notes (Signed)
Patient tolerated infusion well, no questions/concerns voiced. Monitored 30 min post transfusion. Patient stable at discharge. VSS. AVS given.    

## 2022-12-10 NOTE — Patient Instructions (Signed)
Iron Sucrose Injection What is this medication? IRON SUCROSE (EYE ern SOO krose) treats low levels of iron (iron deficiency anemia) in people with kidney disease. Iron is a mineral that plays an important role in making red blood cells, which carry oxygen from your lungs to the rest of your body. This medicine may be used for other purposes; ask your health care provider or pharmacist if you have questions. COMMON BRAND NAME(S): Venofer What should I tell my care team before I take this medication? They need to know if you have any of these conditions: Anemia not caused by low iron levels Heart disease High levels of iron in the blood Kidney disease Liver disease An unusual or allergic reaction to iron, other medications, foods, dyes, or preservatives Pregnant or trying to get pregnant Breastfeeding How should I use this medication? This medication is for infusion into a vein. It is given in a hospital or clinic setting. Talk to your care team about the use of this medication in children. While this medication may be prescribed for children as young as 2 years for selected conditions, precautions do apply. Overdosage: If you think you have taken too much of this medicine contact a poison control center or emergency room at once. NOTE: This medicine is only for you. Do not share this medicine with others. What if I miss a dose? Keep appointments for follow-up doses. It is important not to miss your dose. Call your care team if you are unable to keep an appointment. What may interact with this medication? Do not take this medication with any of the following: Deferoxamine Dimercaprol Other iron products This medication may also interact with the following: Chloramphenicol Deferasirox This list may not describe all possible interactions. Give your health care provider a list of all the medicines, herbs, non-prescription drugs, or dietary supplements you use. Also tell them if you smoke,  drink alcohol, or use illegal drugs. Some items may interact with your medicine. What should I watch for while using this medication? Visit your care team regularly. Tell your care team if your symptoms do not start to get better or if they get worse. You may need blood work done while you are taking this medication. You may need to follow a special diet. Talk to your care team. Foods that contain iron include: whole grains/cereals, dried fruits, beans, or peas, leafy green vegetables, and organ meats (liver, kidney). What side effects may I notice from receiving this medication? Side effects that you should report to your care team as soon as possible: Allergic reactions--skin rash, itching, hives, swelling of the face, lips, tongue, or throat Low blood pressure--dizziness, feeling faint or lightheaded, blurry vision Shortness of breath Side effects that usually do not require medical attention (report to your care team if they continue or are bothersome): Flushing Headache Joint pain Muscle pain Nausea Pain, redness, or irritation at injection site This list may not describe all possible side effects. Call your doctor for medical advice about side effects. You may report side effects to FDA at 1-800-FDA-1088. Where should I keep my medication? This medication is given in a hospital or clinic. It will not be stored at home. NOTE: This sheet is a summary. It may not cover all possible information. If you have questions about this medicine, talk to your doctor, pharmacist, or health care provider.  2024 Elsevier/Gold Standard (2022-07-05 00:00:00)

## 2022-12-11 ENCOUNTER — Encounter
Admission: RE | Admit: 2022-12-11 | Discharge: 2022-12-11 | Disposition: A | Discharge status: Home / Self Care | Payer: 59 | Source: Ambulatory Visit | Attending: Obstetrics and Gynecology | Admitting: Obstetrics and Gynecology

## 2022-12-11 DIAGNOSIS — I159 Secondary hypertension, unspecified: Secondary | ICD-10-CM | POA: Diagnosis not present

## 2022-12-11 DIAGNOSIS — Z01810 Encounter for preprocedural cardiovascular examination: Secondary | ICD-10-CM

## 2022-12-11 DIAGNOSIS — Z01818 Encounter for other preprocedural examination: Secondary | ICD-10-CM | POA: Diagnosis present

## 2022-12-11 LAB — TYPE AND SCREEN
ABO/RH(D): O POS
Antibody Screen: NEGATIVE

## 2022-12-16 ENCOUNTER — Ambulatory Visit
Admission: RE | Admit: 2022-12-16 | Discharge: 2022-12-16 | Disposition: A | Discharge status: Home / Self Care | Payer: 59 | Attending: Obstetrics and Gynecology | Admitting: Obstetrics and Gynecology

## 2022-12-16 ENCOUNTER — Ambulatory Visit: Payer: 59 | Admitting: General Practice

## 2022-12-16 ENCOUNTER — Other Ambulatory Visit: Payer: Self-pay

## 2022-12-16 ENCOUNTER — Encounter
Admission: RE | Disposition: A | Discharge status: Home / Self Care | Payer: Self-pay | Source: Home / Self Care | Attending: Obstetrics and Gynecology

## 2022-12-16 ENCOUNTER — Encounter: Payer: Self-pay | Admitting: Obstetrics and Gynecology

## 2022-12-16 DIAGNOSIS — F17210 Nicotine dependence, cigarettes, uncomplicated: Secondary | ICD-10-CM | POA: Diagnosis not present

## 2022-12-16 DIAGNOSIS — N840 Polyp of corpus uteri: Secondary | ICD-10-CM | POA: Diagnosis not present

## 2022-12-16 DIAGNOSIS — Z8742 Personal history of other diseases of the female genital tract: Secondary | ICD-10-CM | POA: Diagnosis not present

## 2022-12-16 DIAGNOSIS — E0500 Thyrotoxicosis with diffuse goiter without thyrotoxic crisis or storm: Secondary | ICD-10-CM | POA: Diagnosis not present

## 2022-12-16 DIAGNOSIS — D259 Leiomyoma of uterus, unspecified: Secondary | ICD-10-CM | POA: Insufficient documentation

## 2022-12-16 DIAGNOSIS — N920 Excessive and frequent menstruation with regular cycle: Secondary | ICD-10-CM | POA: Diagnosis not present

## 2022-12-16 DIAGNOSIS — Z01818 Encounter for other preprocedural examination: Secondary | ICD-10-CM

## 2022-12-16 DIAGNOSIS — I10 Essential (primary) hypertension: Secondary | ICD-10-CM | POA: Diagnosis not present

## 2022-12-16 DIAGNOSIS — Z79899 Other long term (current) drug therapy: Secondary | ICD-10-CM | POA: Diagnosis not present

## 2022-12-16 HISTORY — PX: LAPAROSCOPIC VAGINAL HYSTERECTOMY WITH SALPINGECTOMY: SHX6680

## 2022-12-16 LAB — POCT PREGNANCY, URINE: Preg Test, Ur: NEGATIVE

## 2022-12-16 SURGERY — HYSTERECTOMY, VAGINAL, LAPAROSCOPY-ASSISTED, WITH SALPINGECTOMY
Anesthesia: General | Site: Uterus | Laterality: Bilateral

## 2022-12-16 MED ORDER — FENTANYL CITRATE (PF) 100 MCG/2ML IJ SOLN
INTRAMUSCULAR | Status: AC
  Filled 2022-12-16: qty 2

## 2022-12-16 MED ORDER — CHLORHEXIDINE GLUCONATE 0.12 % MT SOLN
15.0000 mL | Freq: Once | OROMUCOSAL | Status: AC
  Administered 2022-12-16: 15 mL via OROMUCOSAL

## 2022-12-16 MED ORDER — CEFAZOLIN SODIUM-DEXTROSE 2-4 GM/100ML-% IV SOLN
INTRAVENOUS | Status: AC
  Filled 2022-12-16: qty 100

## 2022-12-16 MED ORDER — LACTATED RINGERS IV SOLN: INTRAVENOUS | Status: DC

## 2022-12-16 MED ORDER — KETOROLAC TROMETHAMINE 30 MG/ML IJ SOLN
INTRAMUSCULAR | Status: AC
  Filled 2022-12-16: qty 1

## 2022-12-16 MED ORDER — DROPERIDOL 2.5 MG/ML IJ SOLN
0.6250 mg | Freq: Once | INTRAMUSCULAR | Status: AC
  Administered 2022-12-16: 0.625 mg via INTRAVENOUS

## 2022-12-16 MED ORDER — VASOPRESSIN 20 UNIT/ML IV SOLN
INTRAVENOUS | Status: DC | PRN
  Administered 2022-12-16: 8 mL via INTRAMUSCULAR

## 2022-12-16 MED ORDER — ONDANSETRON 4 MG PO TBDP: 4.0000 mg | ORAL_TABLET | Freq: Four times a day (QID) | ORAL | Status: DC | PRN

## 2022-12-16 MED ORDER — BUPIVACAINE HCL 0.5 % IJ SOLN
INTRAMUSCULAR | Status: DC | PRN
  Administered 2022-12-16: 10 mL

## 2022-12-16 MED ORDER — POVIDONE-IODINE 10 % EX SWAB
2.0000 | Freq: Once | CUTANEOUS | Status: AC
  Administered 2022-12-16: 2 via TOPICAL

## 2022-12-16 MED ORDER — CHLORHEXIDINE GLUCONATE 0.12 % MT SOLN
OROMUCOSAL | Status: AC
  Filled 2022-12-16: qty 15

## 2022-12-16 MED ORDER — OXYCODONE HCL 5 MG/5ML PO SOLN: 5.0000 mg | Freq: Once | ORAL | Status: AC | PRN

## 2022-12-16 MED ORDER — FENTANYL CITRATE (PF) 100 MCG/2ML IJ SOLN
25.0000 ug | INTRAMUSCULAR | Status: DC | PRN
  Administered 2022-12-16 (×2): 50 ug via INTRAVENOUS

## 2022-12-16 MED ORDER — SODIUM CHLORIDE (PF) 0.9 % IJ SOLN
INTRAMUSCULAR | Status: AC
Start: 2022-12-16 — End: ?
  Filled 2022-12-16: qty 50

## 2022-12-16 MED ORDER — OXYCODONE HCL 5 MG PO TABS
5.0000 mg | ORAL_TABLET | Freq: Once | ORAL | Status: AC | PRN
  Administered 2022-12-16: 5 mg via ORAL

## 2022-12-16 MED ORDER — SUGAMMADEX SODIUM 200 MG/2ML IV SOLN
INTRAVENOUS | Status: DC | PRN
  Administered 2022-12-16: 200 mg via INTRAVENOUS

## 2022-12-16 MED ORDER — ONDANSETRON HCL 4 MG/2ML IJ SOLN
INTRAMUSCULAR | Status: AC
Start: 2022-12-16 — End: ?
  Filled 2022-12-16: qty 2

## 2022-12-16 MED ORDER — VASOPRESSIN 20 UNIT/ML IV SOLN
INTRAVENOUS | Status: AC
  Filled 2022-12-16: qty 1

## 2022-12-16 MED ORDER — CEFAZOLIN SODIUM-DEXTROSE 2-4 GM/100ML-% IV SOLN
2.0000 g | INTRAVENOUS | Status: AC
  Administered 2022-12-16: 2 g via INTRAVENOUS

## 2022-12-16 MED ORDER — HYDROCODONE-ACETAMINOPHEN 5-325 MG PO TABS: 1.0000 | ORAL_TABLET | Freq: Four times a day (QID) | ORAL | 0 refills | Status: DC | PRN

## 2022-12-16 MED ORDER — MIDAZOLAM HCL 2 MG/2ML IJ SOLN
INTRAMUSCULAR | Status: DC | PRN
  Administered 2022-12-16: 2 mg via INTRAVENOUS

## 2022-12-16 MED ORDER — OXYCODONE HCL 5 MG PO TABS
ORAL_TABLET | ORAL | Status: AC
  Filled 2022-12-16: qty 1

## 2022-12-16 MED ORDER — PROPOFOL 10 MG/ML IV BOLUS
INTRAVENOUS | Status: DC | PRN
  Administered 2022-12-16: 100 ug/kg/min via INTRAVENOUS
  Administered 2022-12-16: 200 mg via INTRAVENOUS

## 2022-12-16 MED ORDER — HYDRALAZINE HCL 20 MG/ML IJ SOLN
INTRAMUSCULAR | Status: AC
Start: 2022-12-16 — End: ?
  Filled 2022-12-16: qty 1

## 2022-12-16 MED ORDER — 0.9 % SODIUM CHLORIDE (POUR BTL) OPTIME
TOPICAL | Status: DC | PRN
  Administered 2022-12-16: 500 mL

## 2022-12-16 MED ORDER — ONDANSETRON HCL 4 MG/2ML IJ SOLN
INTRAMUSCULAR | Status: DC | PRN
  Administered 2022-12-16: 4 mg via INTRAVENOUS

## 2022-12-16 MED ORDER — MIDAZOLAM HCL 2 MG/2ML IJ SOLN
INTRAMUSCULAR | Status: AC
  Filled 2022-12-16: qty 2

## 2022-12-16 MED ORDER — DROPERIDOL 2.5 MG/ML IJ SOLN
INTRAMUSCULAR | Status: AC
  Filled 2022-12-16: qty 2

## 2022-12-16 MED ORDER — KETOROLAC TROMETHAMINE 30 MG/ML IJ SOLN
30.0000 mg | Freq: Once | INTRAMUSCULAR | Status: AC
  Administered 2022-12-16: 30 mg via INTRAVENOUS

## 2022-12-16 MED ORDER — HYDRALAZINE HCL 20 MG/ML IJ SOLN
10.0000 mg | Freq: Once | INTRAMUSCULAR | Status: AC
  Administered 2022-12-16: 10 mg via INTRAVENOUS

## 2022-12-16 MED ORDER — ACETAMINOPHEN 10 MG/ML IV SOLN
INTRAVENOUS | Status: AC
Start: 2022-12-16 — End: ?
  Filled 2022-12-16: qty 100

## 2022-12-16 MED ORDER — ROCURONIUM BROMIDE 10 MG/ML (PF) SYRINGE
PREFILLED_SYRINGE | INTRAVENOUS | Status: DC | PRN
  Administered 2022-12-16: 20 mg via INTRAVENOUS
  Administered 2022-12-16: 50 mg via INTRAVENOUS

## 2022-12-16 MED ORDER — DEXMEDETOMIDINE HCL IN NACL 80 MCG/20ML IV SOLN
INTRAVENOUS | Status: DC | PRN
  Administered 2022-12-16: 8 ug via INTRAVENOUS
  Administered 2022-12-16: 4 ug via INTRAVENOUS
  Administered 2022-12-16: 8 ug via INTRAVENOUS

## 2022-12-16 MED ORDER — BUPIVACAINE HCL (PF) 0.5 % IJ SOLN
INTRAMUSCULAR | Status: AC
  Filled 2022-12-16: qty 30

## 2022-12-16 MED ORDER — DEXAMETHASONE SODIUM PHOSPHATE 10 MG/ML IJ SOLN
INTRAMUSCULAR | Status: DC | PRN
  Administered 2022-12-16: 10 mg via INTRAVENOUS

## 2022-12-16 MED ORDER — OXYCODONE-ACETAMINOPHEN 5-325 MG PO TABS: 1.0000 | ORAL_TABLET | ORAL | Status: DC | PRN

## 2022-12-16 MED ORDER — LIDOCAINE HCL (CARDIAC) PF 100 MG/5ML IV SOSY
PREFILLED_SYRINGE | INTRAVENOUS | Status: DC | PRN
  Administered 2022-12-16: 100 mg via INTRAVENOUS

## 2022-12-16 MED ORDER — ACETAMINOPHEN 10 MG/ML IV SOLN
INTRAVENOUS | Status: DC | PRN
  Administered 2022-12-16: 1000 mg via INTRAVENOUS

## 2022-12-16 MED ORDER — ONDANSETRON HCL 4 MG/2ML IJ SOLN
4.0000 mg | Freq: Four times a day (QID) | INTRAMUSCULAR | Status: DC | PRN
  Administered 2022-12-16: 4 mg via INTRAVENOUS

## 2022-12-16 MED ORDER — ESMOLOL HCL 100 MG/10ML IV SOLN
INTRAVENOUS | Status: DC | PRN
  Administered 2022-12-16: 30 mg via INTRAVENOUS
  Administered 2022-12-16: 20 mg via INTRAVENOUS

## 2022-12-16 MED ORDER — ORAL CARE MOUTH RINSE: 15.0000 mL | Freq: Once | OROMUCOSAL | Status: AC

## 2022-12-16 MED ORDER — FENTANYL CITRATE (PF) 100 MCG/2ML IJ SOLN
INTRAMUSCULAR | Status: DC | PRN
  Administered 2022-12-16 (×2): 50 ug via INTRAVENOUS

## 2022-12-16 MED ORDER — GLYCOPYRROLATE 0.2 MG/ML IJ SOLN
INTRAMUSCULAR | Status: DC | PRN
  Administered 2022-12-16: .2 mg via INTRAVENOUS

## 2022-12-16 SURGICAL SUPPLY — 55 items
ADH LQ OCL WTPRF AMP STRL LF (MISCELLANEOUS)
ADH SKN CLS APL DERMABOND .7 (GAUZE/BANDAGES/DRESSINGS) ×1
ADHESIVE MASTISOL STRL (MISCELLANEOUS) IMPLANT
APL PRP STRL LF DISP 70% ISPRP (MISCELLANEOUS)
BAG DRN RND TRDRP ANRFLXCHMBR (UROLOGICAL SUPPLIES) ×1
BAG URINE DRAIN 2000ML AR STRL (UROLOGICAL SUPPLIES) ×1 IMPLANT
BLADE SURG SZ11 CARB STEEL (BLADE) ×1 IMPLANT
CATH FOLEY 2WAY 5CC 16FR (CATHETERS) ×1
CATH URTH 16FR FL 2W BLN LF (CATHETERS) ×1 IMPLANT
CHLORAPREP W/TINT 26 (MISCELLANEOUS) ×1 IMPLANT
DERMABOND ADVANCED .7 DNX12 (GAUZE/BANDAGES/DRESSINGS) ×1 IMPLANT
ELECT REM PT RETURN 9FT ADLT (ELECTROSURGICAL) ×1
ELECTRODE REM PT RTRN 9FT ADLT (ELECTROSURGICAL) ×1 IMPLANT
GAUZE 4X4 16PLY ~~LOC~~+RFID DBL (SPONGE) ×1 IMPLANT
GLOVE PI ORTHO PRO STRL 7.5 (GLOVE) ×2 IMPLANT
GOWN STRL REUS W/ TWL LRG LVL3 (GOWN DISPOSABLE) ×1 IMPLANT
GOWN STRL REUS W/ TWL XL LVL3 (GOWN DISPOSABLE) ×2 IMPLANT
GOWN STRL REUS W/TWL LRG LVL3 (GOWN DISPOSABLE) ×1
GOWN STRL REUS W/TWL XL LVL3 (GOWN DISPOSABLE) ×2
GRASPER SUT TROCAR 14GX15 (MISCELLANEOUS) IMPLANT
HANDLE YANKAUER SUCT BULB TIP (MISCELLANEOUS) IMPLANT
IRRIGATION STRYKERFLOW (MISCELLANEOUS) IMPLANT
IRRIGATOR STRYKERFLOW (MISCELLANEOUS)
IV LACTATED RINGERS 1000ML (IV SOLUTION) ×1 IMPLANT
KIT PINK PAD W/HEAD ARE REST (MISCELLANEOUS) ×1
KIT PINK PAD W/HEAD ARM REST (MISCELLANEOUS) ×1 IMPLANT
KIT TURNOVER CYSTO (KITS) ×1 IMPLANT
LIGASURE LAP MARYLAND 5MM 37CM (ELECTROSURGICAL) IMPLANT
MANIFOLD NEPTUNE II (INSTRUMENTS) ×1 IMPLANT
NDL SPNL 22GX3.5 QUINCKE BK (NEEDLE) ×1 IMPLANT
NEEDLE SPNL 22GX3.5 QUINCKE BK (NEEDLE) ×1
NS IRRIG 1000ML POUR BTL (IV SOLUTION) IMPLANT
PACK BASIN MINOR ARMC (MISCELLANEOUS) ×1 IMPLANT
PACK GYN LAPAROSCOPIC (MISCELLANEOUS) ×1 IMPLANT
PAD OB MATERNITY 4.3X12.25 (PERSONAL CARE ITEMS) ×1 IMPLANT
PAD PREP OB/GYN DISP 24X41 (PERSONAL CARE ITEMS) ×1 IMPLANT
PENCIL SMOKE EVACUATOR (MISCELLANEOUS) IMPLANT
RETRACTOR PHONTONGUIDE ADAPT (ADAPTER) IMPLANT
SCRUB CHG 4% DYNA-HEX 4OZ (MISCELLANEOUS) ×1 IMPLANT
SLEEVE Z-THREAD 5X100MM (TROCAR) ×2 IMPLANT
SOL ELECTROSURG ANTI STICK (MISCELLANEOUS)
SOLUTION ELECTROSURG ANTI STCK (MISCELLANEOUS) IMPLANT
SPONGE T-LAP 18X18 ~~LOC~~+RFID (SPONGE) IMPLANT
STRIP CLOSURE SKIN 1/2X4 (GAUZE/BANDAGES/DRESSINGS) IMPLANT
SUT VIC AB 0 CT1 27 (SUTURE) ×2
SUT VIC AB 0 CT1 27XCR 8 STRN (SUTURE) ×2 IMPLANT
SUT VIC AB 0 CT1 36 (SUTURE) ×1 IMPLANT
SUT VIC AB 4-0 FS2 27 (SUTURE) ×1 IMPLANT
SYR 10ML LL (SYRINGE) ×1 IMPLANT
SYR CONTROL 10ML LL (SYRINGE) ×1 IMPLANT
TAPE TRANSPORE STRL 2 31045 (GAUZE/BANDAGES/DRESSINGS) ×1 IMPLANT
TRAP FLUID SMOKE EVACUATOR (MISCELLANEOUS) ×1 IMPLANT
TROCAR Z-THREAD FIOS 5X100MM (TROCAR) ×1 IMPLANT
TUBING EVAC SMOKE HEATED PNEUM (TUBING) ×1 IMPLANT
WATER STERILE IRR 500ML POUR (IV SOLUTION) ×1 IMPLANT

## 2022-12-16 NOTE — Transfer of Care (Signed)
Immediate Anesthesia Transfer of Care Note  Patient: Natalie Gregory  Procedure(s) Performed: LAPAROSCOPIC ASSISTED VAGINAL HYSTERECTOMY WITH BILATERAL SALPINGECTOMY (Bilateral: Uterus)  Patient Location: PACU   Anesthesia Type:General  Level of Consciousness: awake, drowsy, and patient cooperative  Airway & Oxygen Therapy: Patient Spontanous Breathing  Post-op Assessment: Report given to RN and Post -op Vital signs reviewed and stable  Post vital signs: stable  Last Vitals:  Vitals Value Taken Time  BP 169/91 12/16/22 1230  Temp 36.5 C 12/16/22 1225  Pulse 65 12/16/22 1245  Resp 26 12/16/22 1245  SpO2 100 % 12/16/22 1245  Vitals shown include unfiled device data.  Last Pain:  Vitals:   12/16/22 1225  TempSrc:   PainSc: 0-No pain         Complications: No notable events documented.

## 2022-12-16 NOTE — Op Note (Signed)
OPERATIVE NOTE 12/16/2022 12:26 PM  PRE-OPERATIVE DIAGNOSIS:  1) Menorrhagia uterine fibroids  POST-OPERATIVE DIAGNOSIS:  * No Diagnosis Codes entered *  OPERATION: Procedure(s) (LRB): LAPAROSCOPIC ASSISTED VAGINAL HYSTERECTOMY WITH BILATERAL SALPINGECTOMY (Bilateral)    SURGEON(S): Surgeons and Role:    Linzie Collin, MD - Primary    * Hildred Laser, MD - Assisting   ANESTHESIA: Choice  ESTIMATED BLOOD LOSS: 160 mL  OPERATIVE FINDINGS: Pelvic adhesions and cervical scarring  SPECIMEN:  ID Type Source Tests Collected by Time Destination  1 : Uterus, cervix, and bilateral fallopian tubes GYN Uterus, Cervix, and Bilateral Fallopian Tubes SURGICAL PATHOLOGY Linzie Collin, MD 12/16/2022 1205     COMPLICATIONS: None  DRAINS: Foley to gravity  DISPOSITION: Stable to recovery room  DESCRIPTION OF PROCEDURE:      The patient was prepped and draped in the dorsolithotomy position and placed under general anesthesia. The bladder was emptied. The cervix was grasped with a multi-toothed tenaculum and a uterine manipulator was placed within the cervical os respecting the position and curvature of the uterus. After changing gloves we proceeded abdominally. A small infraumbilical incision was made and a 5 mm trocar port was placed within the abdominopelvic cavity. The opening pressure was less than 7 mmHg.  Approximately 3 and 1/2 L of carbon dioxide gas was instilled within the abdominal pelvic cavity. The laparoscope was placed and the pelvis and abdomen were carefully inspected. In the usual manner, under direct visualization right and left lower quadrant ports of 5 mm size were placed. Both ureters were identified in the pelvis prior to dissection or clamping and cutting of pedicles. The fallopian tube ends were elevated and the mesenteric side systematically coagulated and divided allowing the tube to be removed at the time of uterine removal. The round ligaments were  coagulated and divided and a bladder flap was created. The upper aspect of the broad ligament was clamped coagulated and divided. Significant scarring was present along both adnexa and both ovaries were attached to the uterus by adhesions. The uterine arteries were skeletonized, triply coagulated and divided. Careful inspection of all pedicles and the remainder of the pelvis was performed. Hemostasis was noted. The lower quadrant ports were removed some bleeding was noted from the right lower quadrant port and a PMI was used to tie across the incision.  Hemostasis was noted. The incisions were closed in subcuticular manner. The laparoscope and trocar sleeve were removed from the infraumbilical incision, hemostasis was noted, and the incision was closed in a subcuticular manner. A long-acting anesthetic was employed in the skin incisions. We then proceeded vaginally. A weighted speculum was placed posteriorly. A multi-toothed tenaculum was used to grasp the cervix and the cervix was injected in a circumferential manner with a dilute Pitressin solution. Significant scarring was present of the cervix increasing the difficulty of this dissection. An incision was made around the cervix and the vaginal mucosa was dissected off of the cervix. The posterior cul-de-sac was identified and entered and the weighted speculum was placed within this. The anterior cul-de-sac was identified and entered and a retractor was placed and used to retract the bladder anteriorly keeping it out of the operative field. The uterosacral ligaments were clamped divided and suture ligated. The cardinal ligaments were clamped divided and suture ligated. The small remaining pedicle was clamped divided and suture ligated bilaterally allowing delivery of the specimen. Angle sutures were placed in the usual manner. A culdoplasty was performed. The peritoneum  was identified anteriorly and then incorporating the left upper pedicle left lower pedicle  right lower pedicle right upper pedicle and anterior peritoneum a pursestring suture was placed exteriorizing all pedicles. Hemostasis of all pedicles was noted at this time. The vaginal mucosa was then closed with a running suture of Vicryl. Dr. Valentino Saxon provided exposure, dissection, suctioning, retraction, and general support and assistance during the procedure.  The patient went to recovery room in stable condition. Clear urine was noted in the Foley at the conclusion of the procedure.  Elonda Husky, M.D. 12/16/2022 12:26 PM

## 2022-12-16 NOTE — Anesthesia Procedure Notes (Signed)
Procedure Name: Intubation Date/Time: 12/16/2022 10:12 AM  Performed by: Maryla Morrow., CRNAPre-anesthesia Checklist: Patient identified, Patient being monitored, Timeout performed, Emergency Drugs available and Suction available Patient Re-evaluated:Patient Re-evaluated prior to induction Oxygen Delivery Method: Circle system utilized Preoxygenation: Pre-oxygenation with 100% oxygen Induction Type: IV induction Ventilation: Mask ventilation without difficulty Laryngoscope Size: 3 and McGraph Grade View: Grade I Tube type: Oral Tube size: 6.5 mm Number of attempts: 1 Airway Equipment and Method: Stylet Placement Confirmation: ETT inserted through vocal cords under direct vision, positive ETCO2 and breath sounds checked- equal and bilateral Secured at: 19 cm Tube secured with: Tape Dental Injury: Teeth and Oropharynx as per pre-operative assessment

## 2022-12-16 NOTE — Anesthesia Preprocedure Evaluation (Signed)
Anesthesia Evaluation  Patient identified by MRN, date of birth, ID band Patient awake    Reviewed: Allergy & Precautions, NPO status , Patient's Chart, lab work & pertinent test results  Airway Mallampati: III  TM Distance: >3 FB Neck ROM: full    Dental  (+) Dental Advidsory Given   Pulmonary neg pulmonary ROS, Current Smoker and Patient abstained from smoking.   Pulmonary exam normal        Cardiovascular hypertension, On Medications and On Home Beta Blockers negative cardio ROS Normal cardiovascular exam     Neuro/Psych  PSYCHIATRIC DISORDERS      negative neurological ROS     GI/Hepatic negative GI ROS, Neg liver ROS,,,  Endo/Other  negative endocrine ROS    Renal/GU      Musculoskeletal   Abdominal   Peds  Hematology negative hematology ROS (+)   Anesthesia Other Findings Past Medical History: No date: Anemia No date: Anxiety No date: Atypical squamous cell changes of undetermined significance  (ASCUS) on cervical cytology with negative high risk human papilloma  virus (HPV) test result No date: DUB (dysfunctional uterine bleeding) No date: Fibroid uterus No date: 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) No date: Hypertension No date: Hyperthyroidism 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 No date: Tobacco use No date: Trichomonal vulvovaginitis  Past Surgical History: No date: CESAREAN SECTION     Comment:  x 3  BMI    Body Mass Index: 24.89 kg/m      Reproductive/Obstetrics negative OB ROS                             Anesthesia Physical Anesthesia Plan  ASA: 2  Anesthesia Plan: General ETT and General   Post-op Pain Management:    Induction: Intravenous  PONV Risk Score and Plan: 3 and Ondansetron, Dexamethasone and Midazolam  Airway Management Planned: Oral ETT  Additional Equipment:   Intra-op Plan:   Post-operative  Plan: Extubation in OR  Informed Consent: I have reviewed the patients History and Physical, chart, labs and discussed the procedure including the risks, benefits and alternatives for the proposed anesthesia with the patient or authorized representative who has indicated his/her understanding and acceptance.     Dental Advisory Given  Plan Discussed with: Anesthesiologist, CRNA and Surgeon  Anesthesia Plan Comments: (Patient consented for risks of anesthesia including but not limited to:  - adverse reactions to medications - damage to eyes, teeth, lips or other oral mucosa - nerve damage due to positioning  - sore throat or hoarseness - Damage to heart, brain, nerves, lungs, other parts of body or loss of life  Patient voiced understanding and assent.)       Anesthesia Quick Evaluation

## 2022-12-16 NOTE — Anesthesia Postprocedure Evaluation (Signed)
Anesthesia Post Note  Patient: Natalie Gregory  Procedure(s) Performed: LAPAROSCOPIC ASSISTED VAGINAL HYSTERECTOMY WITH BILATERAL SALPINGECTOMY (Bilateral: Uterus)  Patient location during evaluation: PACU Anesthesia Type: General Level of consciousness: awake and alert Pain management: pain level controlled Vital Signs Assessment: post-procedure vital signs reviewed and stable Respiratory status: spontaneous breathing, nonlabored ventilation, respiratory function stable and patient connected to nasal cannula oxygen Cardiovascular status: blood pressure returned to baseline and stable Postop Assessment: no apparent nausea or vomiting Anesthetic complications: no  There were no known notable events for this encounter.   Last Vitals:  Vitals:   12/16/22 1356 12/16/22 1418  BP: (!) 160/83 (!) 153/78  Pulse: 89 79  Resp: (!) 25 15  Temp: 36.6 C (!) 36.4 C  SpO2: 100% 100%    Last Pain:  Vitals:   12/16/22 1418  TempSrc: Temporal  PainSc: 8                  Stephanie Coup

## 2022-12-16 NOTE — Interval H&P Note (Signed)
History and Physical Interval Note:  12/16/2022 9:21 AM  Natalie Gregory  has presented today for surgery, with the diagnosis of Menorrhagia uterine fibroids.  The various methods of treatment have been discussed with the patient and family. After consideration of risks, benefits and other options for treatment, the patient has consented to  Procedure(s): LAPAROSCOPIC ASSISTED VAGINAL HYSTERECTOMY WITH BILATERAL SALPINGECTOMY (N/A) as a surgical intervention.  The patient's history has been reviewed, patient examined, no change in status, stable for surgery.  I have reviewed the patient's chart and labs.  Questions were answered to the patient's satisfaction.     Brennan Bailey

## 2022-12-17 ENCOUNTER — Inpatient Hospital Stay: Payer: 59

## 2022-12-17 ENCOUNTER — Encounter: Payer: Self-pay | Admitting: Obstetrics and Gynecology

## 2022-12-17 ENCOUNTER — Telehealth: Payer: Self-pay

## 2022-12-17 LAB — SURGICAL PATHOLOGY

## 2022-12-17 NOTE — Telephone Encounter (Signed)
Pt calling; had hyst yesterday; is having just a spot of blood coming from her belly button; wanted to know if this is something to be concerned about.  Adv it wasn't; to hold pressure on it and it should stop; to be seen if she can't get it to stop.  Pt also states she was very nauseas after surgery and finally threw up about 1:30 this morning; adv normal as well and it's d/t the anesthesia.  Pt sates her room looked liked it was foggy; adv same.  States vision is fine today.  Pt appreciative.

## 2022-12-18 ENCOUNTER — Telehealth: Payer: Self-pay

## 2022-12-18 NOTE — Telephone Encounter (Signed)
Spoke with patient. She reports her pain is related to being gassy and constipated. Discussed Gas-X, stool softeners, increasing fiber and other options. She states she will try these for relief.

## 2022-12-18 NOTE — Telephone Encounter (Signed)
Pt calling; had hyst Monday; is still in a good deal of pain; can something a little stronger be called in?  [phone]  Pt states pain level is 7/10.  Pharm is correct in chart.

## 2022-12-19 NOTE — Telephone Encounter (Signed)
Patient called today about her short-term disability paperwork. Could you call and advise patient about forms.

## 2022-12-24 ENCOUNTER — Inpatient Hospital Stay: Payer: 59 | Attending: Oncology

## 2022-12-24 ENCOUNTER — Other Ambulatory Visit: Payer: Self-pay | Admitting: *Deleted

## 2022-12-24 ENCOUNTER — Telehealth: Payer: Self-pay

## 2022-12-24 VITALS — BP 144/89 | HR 88 | Temp 97.9°F

## 2022-12-24 DIAGNOSIS — D509 Iron deficiency anemia, unspecified: Secondary | ICD-10-CM | POA: Diagnosis present

## 2022-12-24 DIAGNOSIS — D508 Other iron deficiency anemias: Secondary | ICD-10-CM

## 2022-12-24 DIAGNOSIS — D649 Anemia, unspecified: Secondary | ICD-10-CM

## 2022-12-24 MED ORDER — CYANOCOBALAMIN 1000 MCG/ML IJ SOLN
1000.0000 ug | Freq: Once | INTRAMUSCULAR | Status: AC
  Administered 2022-12-24: 1000 ug via INTRAMUSCULAR
  Filled 2022-12-24: qty 1

## 2022-12-24 MED ORDER — IRON SUCROSE 20 MG/ML IV SOLN
200.0000 mg | INTRAVENOUS | Status: DC
  Administered 2022-12-24: 200 mg via INTRAVENOUS
  Filled 2022-12-24: qty 10

## 2022-12-24 NOTE — Telephone Encounter (Signed)
Patient had a hysterectomy 12/17/22. She has multiple appointments she has to attend for herself and family member this week. Requesting Dr. Note to obtain temporary handicap sticker.

## 2022-12-25 ENCOUNTER — Encounter: Payer: 59 | Admitting: Obstetrics and Gynecology

## 2022-12-25 DIAGNOSIS — Z9889 Other specified postprocedural states: Secondary | ICD-10-CM

## 2022-12-26 ENCOUNTER — Ambulatory Visit (INDEPENDENT_AMBULATORY_CARE_PROVIDER_SITE_OTHER): Payer: 59 | Admitting: Obstetrics and Gynecology

## 2022-12-26 ENCOUNTER — Encounter: Payer: Self-pay | Admitting: Obstetrics and Gynecology

## 2022-12-26 VITALS — BP 145/87 | HR 76 | Ht 64.0 in | Wt 147.1 lb

## 2022-12-26 DIAGNOSIS — N920 Excessive and frequent menstruation with regular cycle: Secondary | ICD-10-CM

## 2022-12-26 DIAGNOSIS — D259 Leiomyoma of uterus, unspecified: Secondary | ICD-10-CM

## 2022-12-26 DIAGNOSIS — Z48816 Encounter for surgical aftercare following surgery on the genitourinary system: Secondary | ICD-10-CM

## 2022-12-26 DIAGNOSIS — Z9889 Other specified postprocedural states: Secondary | ICD-10-CM

## 2022-12-26 DIAGNOSIS — R300 Dysuria: Secondary | ICD-10-CM

## 2022-12-26 LAB — POCT URINALYSIS DIPSTICK
Bilirubin, UA: NEGATIVE
Glucose, UA: NEGATIVE
Ketones, UA: NEGATIVE
Nitrite, UA: NEGATIVE
Protein, UA: POSITIVE — AB
Spec Grav, UA: 1.02 (ref 1.010–1.025)
Urobilinogen, UA: 0.2 U/dL
pH, UA: 6 (ref 5.0–8.0)

## 2022-12-26 MED ORDER — NITROFURANTOIN MONOHYD MACRO 100 MG PO CAPS: 100.0000 mg | ORAL_CAPSULE | Freq: Two times a day (BID) | ORAL | 0 refills | Status: DC

## 2022-12-26 NOTE — Progress Notes (Signed)
HPI:      [name] is a 47 y.o. [phone] who LMP was Patient's last menstrual period was 11/08/2022 (exact date).  Subjective:   She presents today 1 week postop from [facility].  She reports that she is doing well.  Having minimal pain.  Has some vaginal discharge but no itching or burning.  She does have some burning with urination.    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 She does not have any pertinent problems on file. She  has a past surgical history that includes Cesarean section and Laparoscopic vaginal hysterectomy with salpingectomy (Bilateral, 12/16/2022). Her family history includes Diabetes in her brother, maternal aunt, and maternal uncle; Hypertension in her maternal aunt, maternal uncle, and mother. She  reports that she has been smoking cigarettes. She has never used smokeless tobacco. She reports current alcohol use of about 2.0 - 3.0 standard drinks of alcohol per week. She reports that she does not use drugs. She has a current medication list which includes the following prescription(s): methimazole, metoprolol succinate, and nitrofurantoin (macrocrystal-monohydrate). She has No Known Allergies.       Review of Systems:  Review of Systems  Constitutional: Denied constitutional symptoms, night sweats, recent illness, fatigue, fever, insomnia and weight loss.  Eyes: Denied eye symptoms, eye pain, photophobia, vision change and visual disturbance.  Ears/Nose/Throat/Neck: Denied  ear, nose, throat or neck symptoms, hearing loss, nasal discharge, sinus congestion and sore throat.  Cardiovascular: Denied cardiovascular symptoms, arrhythmia, chest pain/pressure, edema, exercise intolerance, orthopnea and palpitations.  Respiratory: Denied pulmonary symptoms, asthma, pleuritic pain, productive sputum, cough, dyspnea and wheezing.  Gastrointestinal: Denied, gastro-esophageal reflux, melena, nausea and vomiting.  Genitourinary: Denied genitourinary symptoms including symptomatic vaginal discharge, pelvic relaxation issues, and urinary complaints.  Musculoskeletal: Denied musculoskeletal symptoms, stiffness, swelling, muscle weakness and myalgia.  Dermatologic: Denied dermatology symptoms, rash and scar.  Neurologic: Denied neurology symptoms, dizziness, headache, neck pain and syncope.  Psychiatric: Denied psychiatric symptoms, anxiety and depression.  Endocrine: Denied endocrine symptoms including hot flashes and night sweats.   Meds:   Current Outpatient Medications on File Prior to Visit  Medication Sig Dispense Refill   methimazole (TAPAZOLE) 5 MG tablet Take 15 mg by mouth every morning.     metoprolol succinate (TOPROL-XL) 50 MG 24 hr tablet Take 50 mg by mouth every morning.     No current facility-administered medications on file prior to visit.      Objective:     Vitals:   12/26/22 1131 12/26/22 1143  BP: (!) 147/83 (!) 145/87  Pulse: 76    Filed Weights   12/26/22 1131  Weight: 147 lb 1.6 oz (66.7 kg)               Abdomen: Soft.  Non-tender.  No masses.  No HSM.  Incision/s: Intact.  Healing well.  No erythema.  No drainage.             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 Postoperative state   2. Leiomyoma of body of uterus   3. Menorrhagia with regular cycle   4. Dysuria     Patient doing very well postop.   Probable UTI based on symptoms and UA   Plan:            1.  Treat for UTI.  Recommend increase fluid intake  2.  Surgery discussed in detail.  Questions answered.  3.  Plan follow-up in 5 weeks. Orders Orders Placed This Encounter  Procedures   POCT Urinalysis Dipstick     Meds ordered this encounter  Medications   nitrofurantoin, macrocrystal-monohydrate, (MACROBID) 100 MG capsule    Sig: Take 1 capsule (100 mg total) by mouth 2 (two) times daily.    Dispense:  14 capsule    Refill:  0      F/U  Return in about 5 weeks (around 01/30/2023).  Elonda Husky, M.D. 12/26/2022 11:53 AM

## 2022-12-26 NOTE — Progress Notes (Signed)
Patient presents for 1 week postop follow-up following LAVH. She states some burning with urination, no additional concerns.

## 2022-12-27 ENCOUNTER — Inpatient Hospital Stay: Payer: 59

## 2022-12-27 VITALS — BP 155/87 | HR 70 | Temp 99.5°F | Resp 18

## 2022-12-27 DIAGNOSIS — D509 Iron deficiency anemia, unspecified: Secondary | ICD-10-CM | POA: Diagnosis not present

## 2022-12-27 DIAGNOSIS — D508 Other iron deficiency anemias: Secondary | ICD-10-CM

## 2022-12-27 MED ORDER — IRON SUCROSE 20 MG/ML IV SOLN
200.0000 mg | INTRAVENOUS | Status: DC
  Administered 2022-12-27: 200 mg via INTRAVENOUS
  Filled 2022-12-27: qty 10

## 2022-12-27 MED ORDER — SODIUM CHLORIDE 0.9% FLUSH
10.0000 mL | Freq: Once | INTRAVENOUS | Status: AC | PRN
  Administered 2022-12-27: 10 mL
  Filled 2022-12-27: qty 10

## 2022-12-27 NOTE — Progress Notes (Signed)
Pt has been educated and understands. Pt declined to stay 30 mins after iron infusion. VS checked twice.

## 2023-01-15 ENCOUNTER — Ambulatory Visit (INDEPENDENT_AMBULATORY_CARE_PROVIDER_SITE_OTHER): Payer: 59 | Admitting: Obstetrics and Gynecology

## 2023-01-15 ENCOUNTER — Encounter: Payer: Self-pay | Admitting: Obstetrics and Gynecology

## 2023-01-15 VITALS — BP 179/90 | HR 80 | Ht 64.0 in | Wt 149.9 lb

## 2023-01-15 DIAGNOSIS — R3129 Other microscopic hematuria: Secondary | ICD-10-CM

## 2023-01-15 DIAGNOSIS — Z9889 Other specified postprocedural states: Secondary | ICD-10-CM

## 2023-01-15 DIAGNOSIS — R300 Dysuria: Secondary | ICD-10-CM | POA: Diagnosis not present

## 2023-01-15 DIAGNOSIS — Z4889 Encounter for other specified surgical aftercare: Secondary | ICD-10-CM

## 2023-01-15 LAB — POCT URINALYSIS DIPSTICK
Bilirubin, UA: NEGATIVE
Glucose, UA: NEGATIVE
Ketones, UA: NEGATIVE
Nitrite, UA: NEGATIVE
Protein, UA: NEGATIVE
Spec Grav, UA: 1.02 (ref 1.010–1.025)
Urobilinogen, UA: 0.2 U/dL
pH, UA: 6 (ref 5.0–8.0)

## 2023-01-15 MED ORDER — NITROFURANTOIN MONOHYD MACRO 100 MG PO CAPS: 100.0000 mg | ORAL_CAPSULE | Freq: Two times a day (BID) | ORAL | 0 refills | Status: DC

## 2023-01-15 MED ORDER — ESTRADIOL 0.05 MG/24HR TD PTWK: 0.0500 mg | MEDICATED_PATCH | TRANSDERMAL | 2 refills | Status: AC

## 2023-01-15 NOTE — Progress Notes (Signed)
HPI:      [name] is a 47 y.o. [phone] who LMP was No LMP recorded.  Subjective:   She presents today stating that she is having continued urinary symptoms of burning and pressure sensation when she urinates.  She also complains of continued right-sided pain at her incision site.  She has these concerns because she is returning to work next week. When questioned she additionally states that she has begun having hot flashes.    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 She does not have any pertinent problems on file. She  has a past surgical history that includes Cesarean section and Laparoscopic vaginal hysterectomy with salpingectomy (Bilateral, 12/16/2022). Her family history includes Diabetes in her brother, maternal aunt, and maternal uncle; Hypertension in her maternal aunt, maternal uncle, and mother. She  reports that she has been smoking cigarettes. She has never used smokeless tobacco. She reports current alcohol use of about 2.0 - 3.0 standard drinks of alcohol per week. She reports that she does not use drugs. She has a current medication list which includes the following prescription(s): methimazole, metoprolol succinate, and nitrofurantoin (macrocrystal-monohydrate). She has No Known Allergies.       Review of Systems:  Review of Systems  Constitutional: Denied constitutional symptoms, night sweats, recent illness, fatigue, fever, insomnia and weight loss.  Eyes: Denied eye  symptoms, eye pain, photophobia, vision change and visual disturbance.  Ears/Nose/Throat/Neck: Denied ear, nose, throat or neck symptoms, hearing loss, nasal discharge, sinus congestion and sore throat.  Cardiovascular: Denied cardiovascular symptoms, arrhythmia, chest pain/pressure, edema, exercise intolerance, orthopnea and palpitations.  Respiratory: Denied pulmonary symptoms, asthma, pleuritic pain, productive sputum, cough, dyspnea and wheezing.  Gastrointestinal: Denied, gastro-esophageal reflux, melena, nausea and vomiting.  Genitourinary: Denied genitourinary symptoms including symptomatic vaginal discharge, pelvic relaxation issues, and urinary complaints.  Musculoskeletal: Denied musculoskeletal symptoms, stiffness, swelling, muscle weakness and myalgia.  Dermatologic: Denied dermatology symptoms, rash and scar.  Neurologic: Denied neurology symptoms, dizziness, headache, neck pain and syncope.  Psychiatric: Denied psychiatric symptoms, anxiety and depression.  Endocrine: See HPI for additional information.   Meds:   Current Outpatient Medications on File Prior to Visit  Medication Sig Dispense Refill   methimazole (TAPAZOLE) 5 MG tablet Take 15 mg by mouth every morning.     metoprolol succinate (TOPROL-XL) 50 MG 24 hr tablet Take 50 mg by mouth every morning.     No current facility-administered medications on file prior to visit.      Objective:     Vitals:   01/15/23 1020  BP: (!) 179/90  Pulse: 80   Filed Weights   01/15/23 1020  Weight: 149 lb 14.4 oz (68 kg)               Abdomen: Soft.  Non-tender.  No masses.  No HSM.  Incision/s: Intact.  Healing well.  No erythema.  No drainage.   Right side incision carefully examined.  No evidence of hernia.  No evidence of infection.  Minimal pain with deep palpation. UA consistent with  likely UTI.           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 Postoperative state   2. Dysuria   3. Other microscopic hematuria     Incisions healing well.  UTI  Surgical menopausal state with symptoms   Plan:            1.  Urine for culture this time-retreat with Macrobid adjust antibiotics accordingly.  2.  Begin estrogen for hot flashes and menopausal symptoms.  Discussed pill versus patch.  Patient has chosen patch.  3.  Expectant management of right lower quadrant abdominal incision.  Expect resolution of pain in the near future.  Patient may return to work as scheduled.  Orders Orders Placed This Encounter  Procedures   Urine Culture   POCT Urinalysis Dipstick     Meds ordered this encounter  Medications   nitrofurantoin, macrocrystal-monohydrate, (MACROBID) 100 MG capsule    Sig: Take 1 capsule (100 mg total) by mouth 2 (two) times daily.    Dispense:  14 capsule    Refill:  0      F/U  Return in about 6 weeks (around 02/26/2023).  Elonda Husky, M.D. 01/15/2023 10:46 AM

## 2023-01-15 NOTE — Progress Notes (Signed)
Patient presents 4 weeks postop follow-up following LAVH. She states having pain around her incision site on her right side, denies any bleeding or opening. She also reports after completing Macrobid for UTI she remains symptomatic.

## 2023-01-18 LAB — URINE CULTURE

## 2023-01-23 ENCOUNTER — Inpatient Hospital Stay: Payer: 59 | Attending: Oncology

## 2023-01-23 DIAGNOSIS — D509 Iron deficiency anemia, unspecified: Secondary | ICD-10-CM | POA: Diagnosis present

## 2023-01-23 DIAGNOSIS — E538 Deficiency of other specified B group vitamins: Secondary | ICD-10-CM | POA: Diagnosis not present

## 2023-01-23 DIAGNOSIS — D508 Other iron deficiency anemias: Secondary | ICD-10-CM

## 2023-01-23 MED ORDER — CYANOCOBALAMIN 1000 MCG/ML IJ SOLN
1000.0000 ug | Freq: Once | INTRAMUSCULAR | Status: AC
  Administered 2023-01-23: 1000 ug via INTRAMUSCULAR
  Filled 2023-01-23: qty 1

## 2023-01-29 ENCOUNTER — Ambulatory Visit (INDEPENDENT_AMBULATORY_CARE_PROVIDER_SITE_OTHER): Payer: 59 | Admitting: Obstetrics and Gynecology

## 2023-01-29 ENCOUNTER — Encounter: Payer: Self-pay | Admitting: Obstetrics and Gynecology

## 2023-01-29 VITALS — BP 180/100 | Ht 64.0 in | Wt 152.4 lb

## 2023-01-29 DIAGNOSIS — Z9889 Other specified postprocedural states: Secondary | ICD-10-CM

## 2023-01-29 DIAGNOSIS — R300 Dysuria: Secondary | ICD-10-CM

## 2023-01-29 DIAGNOSIS — Z4889 Encounter for other specified surgical aftercare: Secondary | ICD-10-CM

## 2023-01-29 LAB — POCT URINALYSIS DIPSTICK
Bilirubin, UA: NEGATIVE
Blood, UA: NEGATIVE
Glucose, UA: NEGATIVE
Ketones, UA: NEGATIVE
Leukocytes, UA: NEGATIVE
Nitrite, UA: NEGATIVE
Protein, UA: NEGATIVE
Spec Grav, UA: 1.02 (ref 1.010–1.025)
Urobilinogen, UA: 0.2 U/dL
pH, UA: 6 (ref 5.0–8.0)

## 2023-01-29 NOTE — Progress Notes (Signed)
Patient presents for 6 week postop follow-up following LAVH. She states she has not started the HRT patch at this time. Reports still having a tingling sensation with urination.

## 2023-01-29 NOTE — Progress Notes (Signed)
HPI:      [name] is a 47 y.o. [phone] who LMP was Patient's last menstrual period was 11/08/2022 (exact date).  Subjective:   She presents today 6 weeks from LAVH BSO.  Patient says she feels great.  Not having any pain.  Bowel movements and urination without a problem.  Denies vaginal bleeding. She has not yet started her Climara patch but she plans to do this in the near future.  She does report that she is having some night sweats.    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 She does not have any pertinent problems on file. She  has a past surgical history that includes Cesarean section and Laparoscopic vaginal hysterectomy with salpingectomy (Bilateral, 12/16/2022). Her family history includes Diabetes in her brother, maternal aunt, and maternal uncle; Hypertension in her maternal aunt, maternal uncle, and mother. She  reports that she has been smoking cigarettes. She has never used smokeless tobacco. She reports current alcohol use of about 2.0 - 3.0 standard drinks of alcohol per week. She reports that she does not use drugs. She has a current medication list which includes the following prescription(s): methimazole, metoprolol succinate, and estradiol. She has no known allergies.       Review of Systems:  Review of Systems  Constitutional: Denied constitutional symptoms, night sweats, recent illness, fatigue, fever, insomnia and weight loss.  Eyes: Denied eye symptoms, eye pain,  photophobia, vision change and visual disturbance.  Ears/Nose/Throat/Neck: Denied ear, nose, throat or neck symptoms, hearing loss, nasal discharge, sinus congestion and sore throat.  Cardiovascular: Denied cardiovascular symptoms, arrhythmia, chest pain/pressure, edema, exercise intolerance, orthopnea and palpitations.  Respiratory: Denied pulmonary symptoms, asthma, pleuritic pain, productive sputum, cough, dyspnea and wheezing.  Gastrointestinal: Denied, gastro-esophageal reflux, melena, nausea and vomiting.  Genitourinary: Denied genitourinary symptoms including symptomatic vaginal discharge, pelvic relaxation issues, and urinary complaints.  Musculoskeletal: Denied musculoskeletal symptoms, stiffness, swelling, muscle weakness and myalgia.  Dermatologic: Denied dermatology symptoms, rash and scar.  Neurologic: Denied neurology symptoms, dizziness, headache, neck pain and syncope.  Psychiatric: Denied psychiatric symptoms, anxiety and depression.  Endocrine: See HPI for additional information.   Meds:   Current Outpatient Medications on File Prior to Visit  Medication Sig Dispense Refill   methimazole (TAPAZOLE) 5 MG tablet Take 15 mg by mouth every morning.     metoprolol succinate (TOPROL-XL) 50 MG 24 hr tablet Take 50 mg by mouth every morning.     estradiol (CLIMARA - DOSED IN MG/24 HR) 0.05 mg/24hr patch Place 1 patch (0.05 mg total) onto the skin once a week. (Patient not taking: Reported on 01/29/2023) 4 patch 2   No current facility-administered medications on file prior to visit.      Objective:     Vitals:   01/29/23 1318  BP: (!) 180/100   Filed Weights   01/29/23 1318  Weight: 152 lb 6.4 oz (69.1 kg)              Physical examination   Pelvic:   Vulva: Normal appearance.  No lesions.  Vagina: No lesions or abnormalities noted.  Support: Normal pelvic support.  Urethra No masses tenderness or scarring.  Meatus Normal size without lesions or prolapse.   Cervix: Surgically absent  Anus: Normal exam.  No lesions.  Perineum: Normal exam.  No lesions.        Bimanual   Uterus: Surgically absent  Adnexae: No masses.  Non-tender to palpation.  Cul-de-sac: Negative for abnormality.             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 Postoperative state   2. Dysuria     Excellent recovery status post LAVH BSO   Plan:            1.  Patient may resume normal activities with exception of heavy lifting.  2.  Suggest lubrication with intercourse for short time  3.  Fill and use Climara patch as directed. Orders Orders Placed This Encounter  Procedures   POCT Urinalysis Dipstick    No orders of the defined types were placed in this encounter.     F/U  Return in about 3 months (around 04/29/2023).  Elonda Husky, M.D. 01/29/2023 1:46 PM

## 2023-02-24 ENCOUNTER — Inpatient Hospital Stay: Payer: 59 | Attending: Oncology

## 2023-02-24 DIAGNOSIS — N920 Excessive and frequent menstruation with regular cycle: Secondary | ICD-10-CM | POA: Insufficient documentation

## 2023-02-24 DIAGNOSIS — D508 Other iron deficiency anemias: Secondary | ICD-10-CM

## 2023-02-24 DIAGNOSIS — D509 Iron deficiency anemia, unspecified: Secondary | ICD-10-CM | POA: Insufficient documentation

## 2023-02-24 DIAGNOSIS — Z79899 Other long term (current) drug therapy: Secondary | ICD-10-CM | POA: Insufficient documentation

## 2023-02-24 MED ORDER — CYANOCOBALAMIN 1000 MCG/ML IJ SOLN
1000.0000 ug | Freq: Once | INTRAMUSCULAR | Status: AC
  Administered 2023-02-24: 1000 ug via INTRAMUSCULAR
  Filled 2023-02-24: qty 1

## 2023-02-28 ENCOUNTER — Telehealth: Payer: Self-pay | Admitting: *Deleted

## 2023-02-28 NOTE — Telephone Encounter (Signed)
Natalie Gregory called today saying that her HR wants to have again for [place] to say on #7 and #8 to have 10 hours each time.  They wanted [place] to put it on the side and put initials and date.  I circled the 7 and the 8 where it was put in again in and saying 10 hours and the fax went through.  I told [name] that I would do it in the next 10 minutes and it is done

## 2023-03-04 ENCOUNTER — Inpatient Hospital Stay: Payer: 59

## 2023-03-07 ENCOUNTER — Telehealth: Payer: Self-pay | Admitting: *Deleted

## 2023-03-07 NOTE — Telephone Encounter (Signed)
Esther had called saying that the absent 1 FMLA needed to be sent in.  I told Hector that I already sent it in on the 17th.  When she originally talk to me she said that if you do #7 and #8 put it up to 10 hours circle it and put your initials and that is all they needed.  I called over to the absent 1 and spoke to [place] and she said she was going to look and see if she can find the papers she did she says that it specifically says #7 #8 it has been circle it has been dated it has initials.  She is sent a message to the person that is taking care  Kaianna and see if there is any other issues with her form and let us  know. Leannah    knows  about it

## 2023-03-27 ENCOUNTER — Inpatient Hospital Stay: Payer: 59 | Attending: Oncology

## 2023-04-24 ENCOUNTER — Inpatient Hospital Stay: Payer: 59 | Attending: Oncology

## 2023-04-24 ENCOUNTER — Telehealth: Payer: Self-pay | Admitting: Oncology

## 2023-04-24 DIAGNOSIS — Z79899 Other long term (current) drug therapy: Secondary | ICD-10-CM | POA: Insufficient documentation

## 2023-04-24 DIAGNOSIS — E538 Deficiency of other specified B group vitamins: Secondary | ICD-10-CM | POA: Insufficient documentation

## 2023-04-24 DIAGNOSIS — D509 Iron deficiency anemia, unspecified: Secondary | ICD-10-CM | POA: Diagnosis present

## 2023-04-24 DIAGNOSIS — D508 Other iron deficiency anemias: Secondary | ICD-10-CM

## 2023-04-24 MED ORDER — CYANOCOBALAMIN 1000 MCG/ML IJ SOLN
1000.0000 ug | Freq: Once | INTRAMUSCULAR | Status: AC
  Administered 2023-04-24: 1000 ug via INTRAMUSCULAR
  Filled 2023-04-24: qty 1

## 2023-04-24 NOTE — Telephone Encounter (Signed)
Patient called to confirm appointment for today.

## 2023-05-12 DIAGNOSIS — E0500 Thyrotoxicosis with diffuse goiter without thyrotoxic crisis or storm: Secondary | ICD-10-CM | POA: Diagnosis not present

## 2023-05-12 DIAGNOSIS — E559 Vitamin D deficiency, unspecified: Secondary | ICD-10-CM | POA: Diagnosis not present

## 2023-05-26 ENCOUNTER — Inpatient Hospital Stay: Payer: 59 | Attending: Oncology

## 2023-05-29 ENCOUNTER — Encounter: Payer: Self-pay | Admitting: Oncology

## 2023-06-02 ENCOUNTER — Encounter: Payer: Self-pay | Admitting: Oncology

## 2023-06-02 ENCOUNTER — Inpatient Hospital Stay: Payer: 59

## 2023-06-02 ENCOUNTER — Inpatient Hospital Stay: Payer: 59 | Admitting: Oncology

## 2023-06-04 ENCOUNTER — Ambulatory Visit (INDEPENDENT_AMBULATORY_CARE_PROVIDER_SITE_OTHER): Admitting: Podiatry

## 2023-06-04 DIAGNOSIS — Z91199 Patient's noncompliance with other medical treatment and regimen due to unspecified reason: Secondary | ICD-10-CM

## 2023-06-05 NOTE — Progress Notes (Signed)
 Patient was no-show for appointment today

## 2023-06-17 DIAGNOSIS — M2042 Other hammer toe(s) (acquired), left foot: Secondary | ICD-10-CM | POA: Diagnosis not present

## 2023-06-17 DIAGNOSIS — M2041 Other hammer toe(s) (acquired), right foot: Secondary | ICD-10-CM | POA: Diagnosis not present

## 2023-06-17 DIAGNOSIS — B353 Tinea pedis: Secondary | ICD-10-CM | POA: Diagnosis not present

## 2023-06-25 ENCOUNTER — Inpatient Hospital Stay: Payer: 59 | Attending: Oncology

## 2023-07-28 ENCOUNTER — Inpatient Hospital Stay: Payer: 59 | Attending: Oncology

## 2023-08-06 DIAGNOSIS — E0500 Thyrotoxicosis with diffuse goiter without thyrotoxic crisis or storm: Secondary | ICD-10-CM | POA: Diagnosis not present

## 2023-08-06 DIAGNOSIS — E559 Vitamin D deficiency, unspecified: Secondary | ICD-10-CM | POA: Diagnosis not present

## 2023-10-06 DIAGNOSIS — Q828 Other specified congenital malformations of skin: Secondary | ICD-10-CM | POA: Diagnosis not present

## 2023-10-06 DIAGNOSIS — M25871 Other specified joint disorders, right ankle and foot: Secondary | ICD-10-CM | POA: Diagnosis not present

## 2023-10-06 DIAGNOSIS — M898X9 Other specified disorders of bone, unspecified site: Secondary | ICD-10-CM | POA: Diagnosis not present

## 2023-10-06 DIAGNOSIS — M2041 Other hammer toe(s) (acquired), right foot: Secondary | ICD-10-CM | POA: Diagnosis not present

## 2023-10-06 DIAGNOSIS — M25872 Other specified joint disorders, left ankle and foot: Secondary | ICD-10-CM | POA: Diagnosis not present

## 2023-10-06 DIAGNOSIS — M2042 Other hammer toe(s) (acquired), left foot: Secondary | ICD-10-CM | POA: Diagnosis not present

## 2023-10-17 DIAGNOSIS — Z01818 Encounter for other preprocedural examination: Secondary | ICD-10-CM | POA: Diagnosis not present

## 2023-10-17 DIAGNOSIS — E0500 Thyrotoxicosis with diffuse goiter without thyrotoxic crisis or storm: Secondary | ICD-10-CM | POA: Diagnosis not present

## 2023-11-05 ENCOUNTER — Other Ambulatory Visit: Payer: Self-pay | Admitting: Family Medicine

## 2023-11-05 DIAGNOSIS — Z1231 Encounter for screening mammogram for malignant neoplasm of breast: Secondary | ICD-10-CM

## 2023-11-18 ENCOUNTER — Ambulatory Visit
Admission: RE | Admit: 2023-11-18 | Discharge: 2023-11-18 | Disposition: A | Discharge status: Home / Self Care | Source: Ambulatory Visit | Attending: Family Medicine | Admitting: Family Medicine

## 2023-11-18 DIAGNOSIS — Z1231 Encounter for screening mammogram for malignant neoplasm of breast: Secondary | ICD-10-CM | POA: Diagnosis present

## 2023-11-19 ENCOUNTER — Telehealth: Payer: Self-pay

## 2023-11-19 NOTE — Telephone Encounter (Signed)
 Pt requesting call back to schedule colonoscopy

## 2023-11-26 ENCOUNTER — Other Ambulatory Visit: Payer: Self-pay | Admitting: Family Medicine

## 2023-11-26 DIAGNOSIS — R928 Other abnormal and inconclusive findings on diagnostic imaging of breast: Secondary | ICD-10-CM

## 2023-12-08 ENCOUNTER — Inpatient Hospital Stay: Admission: RE | Admit: 2023-12-08 | Source: Ambulatory Visit

## 2023-12-08 ENCOUNTER — Other Ambulatory Visit

## 2023-12-12 ENCOUNTER — Encounter: Payer: Self-pay | Admitting: Oncology

## 2023-12-12 NOTE — Telephone Encounter (Signed)
 Encounter opened in error.

## 2023-12-24 DIAGNOSIS — R21 Rash and other nonspecific skin eruption: Secondary | ICD-10-CM | POA: Diagnosis not present

## 2023-12-29 ENCOUNTER — Ambulatory Visit

## 2023-12-29 DIAGNOSIS — R21 Rash and other nonspecific skin eruption: Secondary | ICD-10-CM | POA: Diagnosis not present

## 2023-12-29 DIAGNOSIS — L308 Other specified dermatitis: Secondary | ICD-10-CM | POA: Diagnosis not present

## 2023-12-29 MED ORDER — TRIAMCINOLONE ACETONIDE 0.1 % EX CREA: TOPICAL_CREAM | CUTANEOUS | 2 refills | Status: DC

## 2023-12-29 MED ORDER — CLOBETASOL PROPIONATE 0.05 % EX OINT: TOPICAL_OINTMENT | CUTANEOUS | 5 refills | Status: DC

## 2023-12-29 NOTE — Patient Instructions (Addendum)
 The bandage should remain in place until tomorrow. Remove the bandages and clean the wound once daily as follows:  - Wash your hands. Clean the wound gently with soap and water, and then pat dry. Do not rub. Apply a small amount of Petroleum Jelly or Vaseline. Cover the area with a Band-Aid.  A small amount of bleeding is normal. If bleeding persists, apply firm pressure over the bandage for 5 to 10 minutes without interruption. If bleeding continues, call our office. Continue to clean the area as directed above until the wound is healed. Shave biopsies may take several weeks to heal. It is normal if the edges are pink/red and the center is slight yellowish or white in color. However if the site becomes hot, swollen, has a thick drainage or redness that expands away from the site please call us . We will contact you with results once available.   Steroid Use  We prescribed you a topical steroid at today's visit.   General application instructions: -Apply this to any affected skin areas, twice (2 times) daily, for two (2) weeks -If the areas are better, you can stop -Re-start the topical steroid if the areas come back, or flare -If the areas don't get better after two weeks, we sometimes recommend taking a break for one (1) week, before restarting for another two (2) weeks. Repeat as needed  The most common side effects of topical steroid medications include changes in skin pigment and thinning of the skin. If the steroid is only applied to affected areas of the skin, these effects rarely occur unless the steroid is used for a very long time (years without stopping).   If we prescribed you a strong steroid, please avoid applying to face, groin, or neck, unless we tell you otherwise. We will include more detail in your prescription instructions.  Skin Care and Sun Protection  Your skin plays an important role in keeping the entire body healthy. Below are some tips on how to try and maximize skin  health from the outside in.  Bathing  Bathe in mildly warm water every 1 to 2 days, followed by light drying and an application of a thick moisturizer cream or ointment, preferably one that comes in a tub.  Recommended body soaps/washes: - Cerave Hydrating Cleanser Bar - Dove Sensitive Skin Fragrance Free Beauty Bar - Aveeno Active Naturals Skin Relief Body Wash, Fragrance Free - Free & Clear (vanicream) liquid cleanser  Moisturizer  Body moisturizer: Apply a moisturizer throughout the day and after bathing.  When you moisturize after bathing, this locks in the moisture.  This can lead to softer and smoother skin.  Body moisturizers come in ointments, creams, and lotions.  If you have dry skin, we recommend the use of ointments or creams rather than lotions.  In other words, something you scoop out of a jar rather than squirted out.  Ointments and creams are thicker and thus provide better moisturization.    Recommended creams for all over: - Vanicream cream - CeraVe Moisturizing Cream - Eucerin Original Healing Soothing Repair Cream  Recommended ointments: greasy, but do the best job at moisturization - Plain Vaseline (petroleum jelly) - CeraVe Healing ointment - Aquaphor Healing ointment  Face moisturizers: For your face, look for something that is labeled as non-comedogenic (won't clog pores) and oil-free. Your moisturizer for the day should have SPF 30 or higher in it as well, but your moisturizer for night can be without SPF. Some good examples are: - CeraVe  Moisturizing Cream (can be used as a face moisturizer) - La Roche-Posay Toleriane Double Repair Facial Moisturizer with SPF 30 (my favorite for day time) - CeraVe AM (has SPF 30) - CeraVe PM  Sunscreen  Who needs sunscreen? Everyone. Sunscreen use can help prevent skin cancer by protecting you from the sun's harmful ultraviolet rays. Anyone can get skin cancer, regardless of age, gender or race. In fact, it is estimated  that one in five Americans will develop skin cancer in their lifetime.  Sunscreen alone cannot fully protect you. In addition to wearing sunscreen, dermatologists recommend taking the following steps to protect your skin and find skin cancer early:  Seek shade when appropriate, remembering that the sun's rays are strongest between 10 a.m. and 2 p.m. If your shadow is shorter than you are, seek shade. Dress to protect yourself from the sun by wearing a lightweight long-sleeved shirt, pants, a wide-brimmed hat and sunglasses, when possible.  Use extra caution near water, snow and sand as they reflect the damaging rays of the sun, which can increase your chance of sunburn.  Get vitamin D safely through a healthy diet that may include vitamin supplements. Don't seek the sun. Avoid tanning beds. Ultraviolet light from the sun and tanning beds can cause skin cancer and wrinkling. If you want to look tan, you may wish to use a self-tanning product, but continue to use sunscreen with it.  When should I use sunscreen? Every day you go outside--even if you're just walking to and from your form of transportation. The sun emits harmful UV rays year-round. Even on cloudy days, up to 80 percent of the sun's harmful UV rays can penetrate your skin. Snow, sand and water increase the need for sunscreen because they reflect the sun's rays.  How much sunscreen should I use, and how often should I apply it? Most people only apply 25-50 percent of the recommended amount of sunscreen. Apply enough sunscreen to cover all exposed skin. Most adults need about 1 ounce -- or enough to fill a shot glass -- to fully cover their body.  Don't forget to apply to the tops of your feet, your neck, your ears and the top of your head. Apply sunscreen to dry skin 15 minutes before going outdoors.  Skin cancer also can form on the lips. To protect your lips, apply a lip balm or lipstick that contains sunscreen with an SPF of 30 or higher.   When outdoors, reapply sunscreen approximately every two hours, or after swimming or sweating, according to the directions on the bottle.   Broad-spectrum sunscreens protect against both UVA and UVB rays. What is the difference between the rays? Sunlight consists of two types of harmful rays that reach the earth -- UVA rays and UVB rays. Overexposure to either can lead to skin cancer. In addition to causing skin cancer, here's what each of these rays do:  UVA rays (or aging rays) can prematurely age your skin, causing wrinkles and age spots, and can pass through window glass. UVB rays (or burning rays) are the primary cause of sunburn and are blocked by window glass  There is no safe way to tan. Every time you tan, you damage your skin. As this damage builds, you speed up the aging of your skin and increase your risk for all types of skin cancer.  What is the difference between chemical and physical sunscreens? Chemical sunscreens work like a sponge, absorbing the sun's rays. They contain one or more  of the following active ingredients: oxybenzone, avobenzone, octisalate, octocrylene, homosalate and octinoxate. These formulations tend to be easier to rub into the skin without leaving a white residue.   Physical sunscreens work like a shield, sitting sit on the surface of your skin and deflecting the sun's rays. They contain the active ingredients zinc oxide and/or titanium dioxide. Use this sunscreen if you have sensitive skin.   What type of sunscreen should I use? The best type of sunscreen is the one you will use again and again. Just make sure it offers broad-spectrum (UVA and UVB) protection, has an SPF of 30+, and is water-resistant. The kind of sunscreen you use is a matter of personal choice, and may vary depending on the area of the body to be protected. Available sunscreen options include lotions, creams, gels, ointments, wax sticks and sprays.  Recommended physical sunscreens for face: -  Neutrogena Sheer Zinc - Aveeno Positively Mineral Sensitive - CeraVe Hydrating Mineral (also has a tinted version) - La Roche-Posay Anthelios Mineral Face (comes as a cream, lotion, light fluid, and there is also a tinted version).  - EltaMD UV Clear (also has a tinted version)  Recommended physical sunscreens for body: - Neutrogena Sheer Zinc Dry-Touch Sunscreen Sensitive Skin Lotion Broad Spectrum SPF 50 - Aveeno Positively Mineral Sensitive Skin Sunscreen Broad Spectrum SPF 50 - La Roche-Posay Anthelios SPF 50 Mineral Sunscreen - Gentle Lotion - CeraVe Hydrating Mineral Sunscreen SPF 50  Recommended chemical sunscreens for face: - Anthelios UV Correct Face Sunscreen SPF 70 with Niacinamide - Neutrogena Clear Face Oil-Free SPF 50 with Helioplex - Neutrogena Sport Face Oil-Free SPF 70+ with Helioplex - Aveeno Protect + Hydrate Sunscreen For Face SPF 70 - La Roche-Posay Anthelios Light Fluid Sunscreen for Face SPF 60  Recommended chemical sunscreens for body: - Neutrogena Ultra Sheer Dry-Touch Sunscreen SPF 70 - Aveeno Protect + Hydrate Broad Spectrum All-Day Hydration SPF 60 (comes in a big pump) - La Roche-Posay Anthelios Melt-In Milk Sunscreen SPF 60  Recommended UPF Clothing - Coolibar  - Solbari  - Wallaroo hats  - Materials Engineer (On Amazon)    Gentle Skin Care Guide  1. Bathe no more than once a day.  2. Avoid bathing in hot water  3. Use a mild soap like Dove, Vanicream, Cetaphil, CeraVe. Can use Lever 2000 or Cetaphil antibacterial soap  4. Use soap only where you need it. On most days, use it under your arms, between your legs, and on your feet. Let the water rinse other areas unless visibly dirty.  5. When you get out of the bath/shower, use a towel to gently blot your skin dry, don't rub it.  6. While your skin is still a little damp, apply a moisturizing cream such as Vanicream, CeraVe, Cetaphil, Eucerin, Sarna lotion or plain Vaseline Jelly. For hands apply Neutrogena  Norwegian Hand Cream or Excipial Hand Cream.  7. Reapply moisturizer any time you start to itch or feel dry.  8. Sometimes using free and clear laundry detergents can be helpful. Fabric softener sheets should be avoided. Downy Free & Gentle liquid, or any liquid fabric softener that is free of dyes and perfumes, it acceptable to use  9. If your doctor has given you prescription creams you may apply moisturizers over them   Moisturizer: Apply a moisturizer throughout the day and after bathing.  When you moisturize after bathing, this locks in the moisture.  This can lead to softer and smoother skin.  Body moisturizers come in ointments,  creams, and lotions.  If you have dry skin, we recommend the use of ointments or creams rather than lotions.  In other words, something you scoop out of a jar rather than squirted out.  Ointments and creams are thicker and thus provide better moisturization.      Moisturizers Apply a moisturizer to your skin at least once a day (even if you do not bathe).   Cool moisturizers on your skin help with itching (to accomplish this, place your moisturizers and medicated creams in the refrigerator). In general, people with dry skin need a moisturizer that is scooped and not squirted.  - Ointments (petrolatum ointment): greasy, but are the best moisturizers Vaseline, Aquaphor  - Creams (thick, white cream that comes in a jar and is scooped with your hand) Cerave, Cetaphil, Eucerin, Vanicream  - Lotions (comes in a pump) is the weakest moisturizer but is an acceptable choice for the face if you have an oily face Cetaphil, Cerave, Curel, Neutrogena, Lubriderm, Aveeno   Due to recent changes in healthcare laws, you may see results of your pathology and/or laboratory studies on MyChart before the doctors have had a chance to review them. We understand that in some cases there may be results that are confusing or concerning to you. Please understand that not all results  are received at the same time and often the doctors may need to interpret multiple results in order to provide you with the best plan of care or course of treatment. Therefore, we ask that you please give us  2 business days to thoroughly review all your results before contacting the office for clarification. Should we see a critical lab result, you will be contacted sooner.   If You Need Anything After Your Visit  If you have any questions or concerns for your doctor, please call our main line at [phone] and press option 4 to reach your doctor's medical assistant. If no one answers, please leave a voicemail as directed and we will return your call as soon as possible. Messages left after 4 pm will be answered the following business day.   You may also send us  a message via MyChart. We typically respond to MyChart messages within 1-2 business days.  For prescription refills, please ask your pharmacy to contact our office. Our fax number is [phone].  If you have an urgent issue when the clinic is closed that cannot wait until the next business day, you can page your doctor at the number below.    Please note that while we do our best to be available for urgent issues outside of office hours, we are not available 24/7.   If you have an urgent issue and are unable to reach us , you may choose to seek medical care at your doctor's office, retail clinic, urgent care center, or emergency room.  If you have a medical emergency, please immediately call 911 or go to the emergency department.  Pager Numbers  - Dr. Hester: [phone]  - Dr. Jackquline: [phone]  - Dr. Claudene: [phone]   In the event of inclement weather, please call our main line at [phone] for an update on the status of any delays or closures.  Dermatology Medication Tips: Please keep the boxes that topical medications come in in order to help keep track of the instructions about where and how to use these.  Pharmacies typically print the medication instructions only on the boxes and not directly on the medication tubes.   If your medication  is too expensive, please contact our office at [phone] option 4 or send us  a message through MyChart.   We are unable to tell what your co-pay for medications will be in advance as this is different depending on your insurance coverage. However, we may be able to find a substitute medication at lower cost or fill out paperwork to get insurance to cover a needed medication.   If a prior authorization is required to get your medication covered by your insurance company, please allow us  1-2 business days to complete this process.  Drug prices often vary depending on where the prescription is filled and some pharmacies may offer cheaper prices.  The website www.goodrx.com contains coupons for medications through different pharmacies. The prices here do not account for what the cost may be with help from insurance (it may be cheaper with your insurance), but the website can give you the price if you did not use any insurance.  - You can print the associated coupon and take it with your prescription to the pharmacy.  - You may also stop by our office during regular business hours and pick up a GoodRx coupon card.  - If you need your prescription sent electronically to a different pharmacy, notify our office through [facility] or by phone at [phone] option 4.     Si Usted Necesita Algo Despus de Su Visita  Tambin puede enviarnos un mensaje a travs de Clinical Cytogeneticist. Por lo general respondemos a los mensajes de MyChart en el transcurso de 1 a 2 das hbiles.  Para renovar recetas, por favor pida a su farmacia que se ponga en contacto con nuestra oficina. Randi lakes de fax es [place] [phone].  Si tiene un asunto urgente cuando la clnica est cerrada y que no puede esperar hasta el siguiente da hbil, puede llamar/localizar a su doctor(a) al nmero  que aparece a continuacin.   Por favor, tenga en cuenta que aunque hacemos todo lo posible para estar disponibles para asuntos urgentes fuera del horario de [place], no estamos disponibles las 24 horas del da, los 7 [address]  [address] de la [place].   Si tiene un problema urgente y no puede comunicarse con nosotros, puede optar por buscar atencin mdica  en el consultorio de su doctor(a), en una clnica privada, en un centro de atencin urgente o en una sala de emergencias.  Si tiene engineer, drilling, por favor llame inmediatamente al 911 o vaya a la sala de emergencias.  Nmeros de bper  - Dr. Hester: [phone]  - Dra. Jackquline: [phone]  - Dr. Claudene: [phone]   En caso de inclemencias del tiempo, por favor llame a landry capes principal al [phone] para una actualizacin sobre el [place] de cualquier retraso o cierre.  Consejos para la medicacin en dermatologa: Por favor, guarde las cajas en las que vienen los medicamentos de uso tpico para ayudarle a seguir las instrucciones sobre dnde y cmo usarlos. Las farmacias generalmente imprimen las instrucciones del medicamento slo en las cajas y no directamente en los tubos del [place].   Si su medicamento es muy caro, por favor, pngase en contacto con landry rieger llamando al [phone] y presione la opcin 4 o envenos un mensaje a travs de Clinical Cytogeneticist.   No podemos decirle cul ser su copago por los medicamentos por adelantado ya que esto es diferente dependiendo de la cobertura de su seguro. Sin embargo, es posible que podamos encontrar un medicamento sustituto a audiological scientist un formulario para que  el seguro cubra el medicamento que se considera necesario.   Si se requiere una autorizacin previa para que su compaa de seguros cubra su medicamento, por favor permtanos de 1 a 2 das hbiles para completar este proceso.  Los precios de los medicamentos varan con frecuencia dependiendo del environmental consultant de dnde se  surte la receta y alguna farmacias pueden ofrecer precios ms baratos.  El sitio web www.goodrx.com tiene cupones para medicamentos de health and safety inspector. Los precios aqu no tienen en cuenta lo que podra costar con la ayuda del seguro (puede ser ms barato con su seguro), pero el sitio web puede darle el precio si no utiliz tourist information centre manager.  - Puede imprimir el cupn correspondiente y llevarlo con su receta a la farmacia.  - Tambin puede pasar por nuestra oficina durante el horario de atencin regular y education officer, museum una tarjeta de cupones de GoodRx.  - Si necesita que su receta se enve electrnicamente a una farmacia diferente, informe a nuestra oficina a travs de MyChart de [place] o por telfono llamando al [phone] y presione la opcin 4.

## 2023-12-29 NOTE — Progress Notes (Signed)
 Subjective   Natalie Gregory is a 48 y.o. female who presents for the following: Rash. Patient is new patient.  Today patient reports: Patient here for rash all over about a month ago, a little itchy. Patient was given a DepoMedrol injection at [facility]. Patient is on methimazole for her thyroid  and states it was recently increased. No other systemic sx.   Review of Systems:    No other skin or systemic complaints except as noted in HPI or Assessment and Plan.  The following portions of the chart were reviewed this encounter and updated as appropriate: medications, allergies, medical history  Relevant Medical History:  n/a   Objective  (SKPE) Well appearing patient in no apparent distress; mood and affect are within normal limits. Examination was performed of the: Focused Exam of: Upper and lower extremities   Examination notable for: Skin colored papules diffusely on upper extremities, neck, upper back   Palms clear  Examination limited by: Undergarments, Clothing, and Patient deferred removal              Right posterior arm Skin colored papules diffusely on upper extremities neck, upper back for one month    Assessment & Plan  (SKAP)   Rash of uncertain etiology, ddx papular urticaria vs eczema  - Undiagnosed new problem with uncertain prognosis  - Differential diagnosis, treatment options, prognosis, risk/ benefit, and side effects of treatment were discussed with the patient.  - To help confirm diagnosis, biopsy(s) obtained today.    start triamcinolone cream 0.1% twice daily to affected areas of skin Discussed side effect of potent topical steroids including atrophy, dyspigmentation, striae, telangectasia, folliculitis, loss of skin pigment, hair growth, tachyphylaxis, risk of systemic absorption with missuse. - Start clobetasol ointment 0.05% twice daily to affected skin Discussed side effect of super potent topical steroids including atrophy,  dyspigmentation, striae, telangectasia, folliculitis, loss of skin pigment, hair growth, tachyphylaxis, risk of systemic absorption with missuse.   Level of service outlined above   Patient instructions (SKPI)   Procedures, orders, diagnosis for this visit:  RASH Right posterior arm Skin / nail biopsy - Right posterior arm Type of biopsy: punch   Informed consent: discussed and consent obtained   Timeout: patient name, date of birth, surgical site, and procedure verified   Procedure prep:  Patient was prepped and draped in usual sterile fashion Prep type:  Isopropyl alcohol Anesthesia: the lesion was anesthetized in a standard fashion   Anesthetic:  1% lidocaine  w/ epinephrine 1-100,000 buffered w/ 8.4% NaHCO3 Punch size:  4 mm Suture size:  4-0 Suture type: Prolene (polypropylene)   Suture removal (days):  7 Hemostasis achieved with: suture, pressure and aluminum chloride   Outcome: patient tolerated procedure well   Post-procedure details: sterile dressing applied and wound care instructions given   Dressing type: bandage and petrolatum    Specimen 1 - Surgical pathology Differential Diagnosis: Eczema vs papule urticaria vs infectious vs drug reaction vs other  Check Margins: No Skin colored papules diffusely on upper extremities neck, upper back for one month  Rash -     Skin / nail biopsy -     Surgical pathology; Standing  Other orders -     Triamcinolone Acetonide; Apply 7 gram twice daily to affected areas of skin. Stop once resolved and restart as needed for flares. Avoid use on face, armpits, groin unless otherwise indicated.  Dispense: 454 g; Refill: 2 -     Clobetasol Propionate; Apply 1 gram  topically to affected area of skin twice daily. Stop once resolved and restart as needed for flares. Avoid use on face, armpits, groin unless otherwise indicated.  Dispense: 60 g; Refill: 5    Return to clinic: Return in about 1 week (around 01/05/2024) for SR w/ Dr.  Raymund.  I, Jacquelynn V. Wilfred, CMA, am acting as scribe for Lauraine JAYSON Raymund, MD.  Documentation: I have reviewed the above documentation for accuracy and completeness, and I agree with the above.  Lauraine JAYSON Raymund, MD

## 2023-12-30 ENCOUNTER — Other Ambulatory Visit: Payer: Self-pay

## 2023-12-30 MED ORDER — CLOBETASOL PROPIONATE 0.05 % EX OINT: TOPICAL_OINTMENT | CUTANEOUS | 5 refills | Status: AC

## 2023-12-30 MED ORDER — TRIAMCINOLONE ACETONIDE 0.1 % EX CREA: TOPICAL_CREAM | CUTANEOUS | 2 refills | Status: AC

## 2023-12-30 NOTE — Progress Notes (Signed)
 Patient states [facility] has no RX for her. Resent. aw

## 2023-12-31 LAB — SURGICAL PATHOLOGY

## 2024-01-01 ENCOUNTER — Ambulatory Visit: Payer: Self-pay

## 2024-01-01 NOTE — Telephone Encounter (Signed)
 Patient informed of pathology results

## 2024-01-01 NOTE — Telephone Encounter (Signed)
-----   Message from Lauraine JAYSON Kanaris sent at 01/01/2024 12:46 PM EST -----  1. Skin, right posterior arm :       SPONGIOTIC DERMATITIS, SEE DESCRIPTION   Please notify patient with below plan: Bx most consistent with eczema  Continue topical steroids, can discuss systemic medication at 11/24 visit  ----- Message ----- From: Interface, Lab In Three Zero One Sent: 12/31/2023   3:42 PM EST To: Lauraine JAYSON Kanaris, MD

## 2024-01-05 ENCOUNTER — Encounter

## 2024-01-06 ENCOUNTER — Ambulatory Visit
Admission: EM | Admit: 2024-01-06 | Discharge: 2024-01-06 | Disposition: A | Discharge status: Home / Self Care | Attending: Emergency Medicine | Admitting: Emergency Medicine

## 2024-01-06 DIAGNOSIS — L84 Corns and callosities: Secondary | ICD-10-CM | POA: Diagnosis not present

## 2024-01-06 DIAGNOSIS — M79671 Pain in right foot: Secondary | ICD-10-CM

## 2024-01-06 MED ORDER — KETOCONAZOLE 2 % EX CREA: 1.0000 | TOPICAL_CREAM | Freq: Every day | CUTANEOUS | 1 refills | Status: AC

## 2024-01-06 MED ORDER — IBUPROFEN 600 MG PO TABS
600.0000 mg | ORAL_TABLET | Freq: Four times a day (QID) | ORAL | 0 refills | Status: AC | PRN
Start: 2024-01-06 — End: ?

## 2024-01-06 NOTE — ED Provider Notes (Signed)
 HPI  SUBJECTIVE:  Natalie Gregory is a 48 y.o. female who presents with 2 weeks of burning pain between all of her toes on her right foot.  She is not sure if there is any skin breakdown.  No odor, itching, discharge.  She reports skin buildup between her toes that has gotten worse over the past 2 weeks.  No fevers, erythema or swelling of the foot or toes.  She has tried liquid corn medicine and using corn pads to space her toes out without improvement in her symptoms.  Symptoms are worse with bending her toes and with walking.  Patient has a past medical history of hypertension, Graves' disease.  No history of diabetes, neuropathy, skin yeast infections, impetigo, MRSA.  She was last seen by [facility] podiatry for hammertoes and sesamoiditis.  Surgery recommended to remove bony prominences.  She states she has a follow-up appointment with podiatry on Monday.  [facility]P: [facility].   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)    Hypertension    Hyperthyroidism    Late latent syphilis    Tobacco use    Trichomonal vulvovaginitis     Past Surgical History:  Procedure Laterality Date   CESAREAN SECTION     x 3   LAPAROSCOPIC VAGINAL HYSTERECTOMY WITH SALPINGECTOMY Bilateral 12/16/2022   Procedure: LAPAROSCOPIC ASSISTED VAGINAL HYSTERECTOMY WITH BILATERAL SALPINGECTOMY;  Surgeon: Janit Alm Agent, MD;  Location: [facility] ORS;  Service: Gynecology;  Laterality: Bilateral;    Family History  Problem Relation Age of Onset   Hypertension Mother    Diabetes Maternal Aunt    Hypertension Maternal Aunt    Diabetes Maternal Uncle    Hypertension Maternal Uncle    Diabetes  Brother    Breast cancer Neg Hx     Social History   Tobacco Use   Smoking status: Every Day    Current packs/day: 0.50    Types: Cigarettes   Smokeless tobacco: Never  Vaping Use   Vaping status: Never Used  Substance Use Topics   Alcohol use: Yes    Alcohol/week: 2.0 - 3.0 standard drinks of alcohol    Types: 2 - 3 Cans of beer per week    Comment: 2-3/day   Drug use: No    No current facility-administered medications for this encounter.  Current Outpatient Medications:    ibuprofen  (ADVIL ) 600 MG tablet, Take 1 tablet (600 mg total) by mouth every 6 (six) hours as needed., Disp: 30 tablet, Rfl: 0   ketoconazole  (NIZORAL ) 2 % cream, Apply 1 Application topically daily. Apply bid until rash resolved and for 1 week after, Disp: 30 g, Rfl: 1   clobetasol  ointment (TEMOVATE ) 0.05 %, Apply 1 gram topically to affected area of skin twice daily. Stop once resolved and restart as needed for flares. Avoid use on face, armpits, groin unless otherwise indicated., Disp: 60 g, Rfl: 5   estradiol  (CLIMARA  - DOSED IN MG/24 HR) 0.05 mg/24hr patch, Place 1 patch (0.05 mg total) onto the skin once a week. (Patient not taking: Reported on 01/29/2023), Disp: 4 patch, Rfl: 2   methimazole (TAPAZOLE) 5 MG tablet, Take 15 mg by mouth  every morning., Disp: , Rfl:    metoprolol succinate (TOPROL-XL) 50 MG 24 hr tablet, Take 50 mg by mouth every morning., Disp: , Rfl:    triamcinolone  cream (KENALOG ) 0.1 %, Apply 7 gram twice daily to affected areas of skin. Stop once resolved and restart as needed for flares. Avoid use on face, armpits, groin unless otherwise indicated., Disp: 454 g, Rfl: 2  No Known Allergies   ROS  As noted in HPI.   Physical Exam  BP (!) 162/94 (BP Location: Right Arm)   Pulse 63   Temp 98.6 F (37 C) (Oral)   Resp 20   LMP 11/08/2022 (Exact Date) Comment: UPreg negative per [place], RN  SpO2 100%   Constitutional: Well developed, well nourished, no acute distress Eyes:   EOMI, conjunctiva normal bilaterally HENT: Normocephalic, atraumatic,mucus membranes moist Respiratory: Normal inspiratory effort Cardiovascular: Normal rate GI: nondistended skin: Mildly tender callused areas between toes 2 through 5 right foot skin appears intact, flaky.  No erythema, maceration, discharge.  No pus.               Musculoskeletal: no deformities Neurologic: Alert & oriented x 3, no focal neuro deficits Psychiatric: Speech and behavior appropriate   ED Course   Medications - No data to display  No orders of the defined types were placed in this encounter.   No results found for this or any previous visit (from the past 24 hours). No results found.  ED Clinical Impression  1. Callus between toes   2. Right foot pain      ED Assessment/Plan    Patient consented to the use of clinical photography.  Patient presents with calluses between all of her toes.  She was reporting burning pain, this could be nerve pain, inflammation, but I am also wondering about yeast dermatitis.  There does not appear to be any cellulitis.  We can try warm Epsom salt soaks to soften the skin, patient to dry off thoroughly with a hair dryer and then apply topical miconazole twice daily until 1 week after clinical resolution or if advised to discontinue this by podiatry.  She has an appoint with them on Monday.  Tylenol  combined with ibuprofen  together 3-4 times a day as needed for pain.  Discussed MDM, treatment plan, and plan for follow-up with patient.  patient agrees with plan.   Meds ordered this encounter  Medications   ketoconazole  (NIZORAL ) 2 % cream    Sig: Apply 1 Application topically daily. Apply bid until rash resolved and for 1 week after    Dispense:  30 g    Refill:  1   ibuprofen  (ADVIL ) 600 MG tablet    Sig: Take 1 tablet (600 mg total) by mouth every 6 (six) hours as needed.    Dispense:  30 tablet    Refill:  0      *This clinic note was  created using Scientist, clinical (histocompatibility and immunogenetics). Therefore, there may be occasional mistakes despite careful proofreading.  ?    Van Knee, MD 01/06/24 1711

## 2024-01-06 NOTE — Discharge Instructions (Addendum)
 Try warm Epsom salt soaks to soften the skin, then dry off thoroughly with a hair dryer and then apply topical miconazole twice daily until 1 week after clinical resolution or if advised to discontinue this by podiatry.  600 mg of ibuprofen  combined with 1000 mg of Tylenol  together 3-4 times a day as needed for pain.

## 2024-01-06 NOTE — ED Triage Notes (Signed)
 Pt reports pain in the right foot, states she was supposed to have surgery on the foot but has not had it done yet.

## 2024-01-15 ENCOUNTER — Ambulatory Visit

## 2024-01-15 DIAGNOSIS — Z48817 Encounter for surgical aftercare following surgery on the skin and subcutaneous tissue: Secondary | ICD-10-CM

## 2024-01-15 DIAGNOSIS — R21 Rash and other nonspecific skin eruption: Secondary | ICD-10-CM

## 2024-01-15 NOTE — Progress Notes (Signed)
    Subjective   Natalie Gregory is a 48 y.o. female who presents for the following: patient here for suture removal and discuss biopsy results. Patient report rash is all clear she is doing much better.  Patient is established patient   Review of Systems:    No other skin or systemic complaints except as noted in HPI or Assessment and Plan.  The following portions of the chart were reviewed this encounter and updated as appropriate: medications, allergies, medical history  Relevant Medical History:  Na   Objective  (SKPE) Well appearing patient in no apparent distress; mood and affect are within normal limits. Examination was performed of the: Focused Exam of: face,arms    Examination notable for: post inflammatory hypopigmentation on arms     Assessment & Plan  (SKAP) ]  Encounter for Removal of Sutures - Incision site at the right posterior arm  is clean, dry and intact - Wound cleansed, sutures removed, wound cleansed  - Discussed pathology results showing Eczema  -- Scars remodel for a full year. - - Patient advised to call with any concerns or if they notice any new or changing lesions.   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  - Reviewed benign but chronic nature of disease. - Discussed dry skin care at length, recommended avoidance of fragrances, short showers with luke- warm water, no scrubbing, an unscented moisturizing soap (e.g. Dove sensitive skin) limited to the groin and axillae, and frequent emollient use (Eucerin, Aquaphor, Cerave, Vanicream, Vaseline). -  can continue topical steroids prn - advised to contact office if flaring     Level of service outlined above   Patient instructions (SKPI)   Procedures, orders, diagnosis for this  visit:    There are no diagnoses linked to this encounter.  Return to clinic: Return if symptoms worsen or fail to improve.  IFay Kirks, CMA, am acting as scribe for Lauraine JAYSON Kanaris, MD .   Documentation: I have reviewed the above documentation for accuracy and completeness, and I agree with the above.  Lauraine JAYSON Kanaris, MD

## 2024-01-15 NOTE — Patient Instructions (Signed)

## 2024-03-19 ENCOUNTER — Other Ambulatory Visit: Payer: Self-pay

## 2024-04-08 ENCOUNTER — Ambulatory Visit: Admit: 2024-04-08
